# Patient Record
Sex: Female | Born: 1955
[De-identification: ages and names within clinical notes are randomized; demographics above are authoritative.]

---

## 2019-03-21 ENCOUNTER — Inpatient Hospital Stay
Admit: 2019-03-21 | Discharge: 2019-03-22 | Disposition: A | Discharge status: Home / Self Care | Payer: BLUE CROSS/BLUE SHIELD | Source: Ambulatory Visit

## 2019-03-22 DIAGNOSIS — D106 Benign neoplasm of nasopharynx: Secondary | ICD-10-CM

## 2022-06-18 IMAGING — DX HIP BILATERAL WITH PELVIS 5 VIEWS
1 series · 5 of 5 positions shown · non-contrast
Comparison: None.

________________________________________________________________________________________________ 
HIP BILATERAL WITH PELVIS 5 VIEWS, 06/18/2022 [DATE]: 
CLINICAL INDICATION: Pain In Unspecified Hip.

[Series 1: AP · U · 0.14mm/px · 5 of 5 slices shown]
[im 1/5]
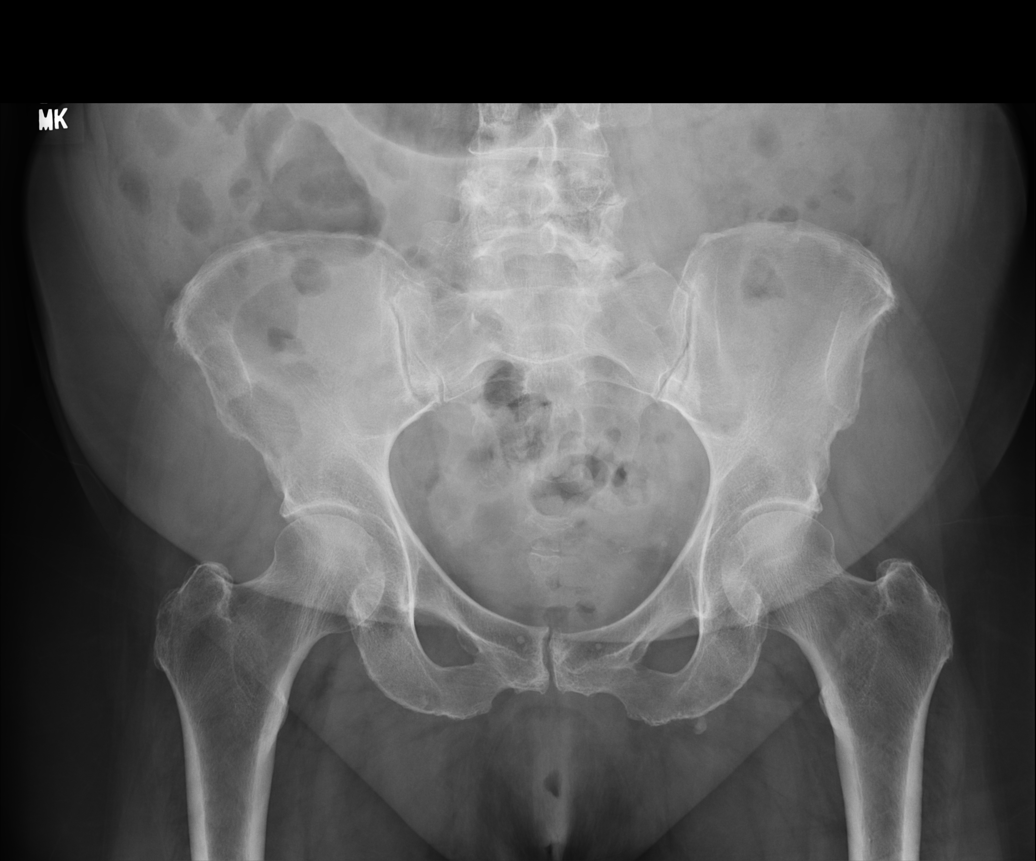
[im 2/5]
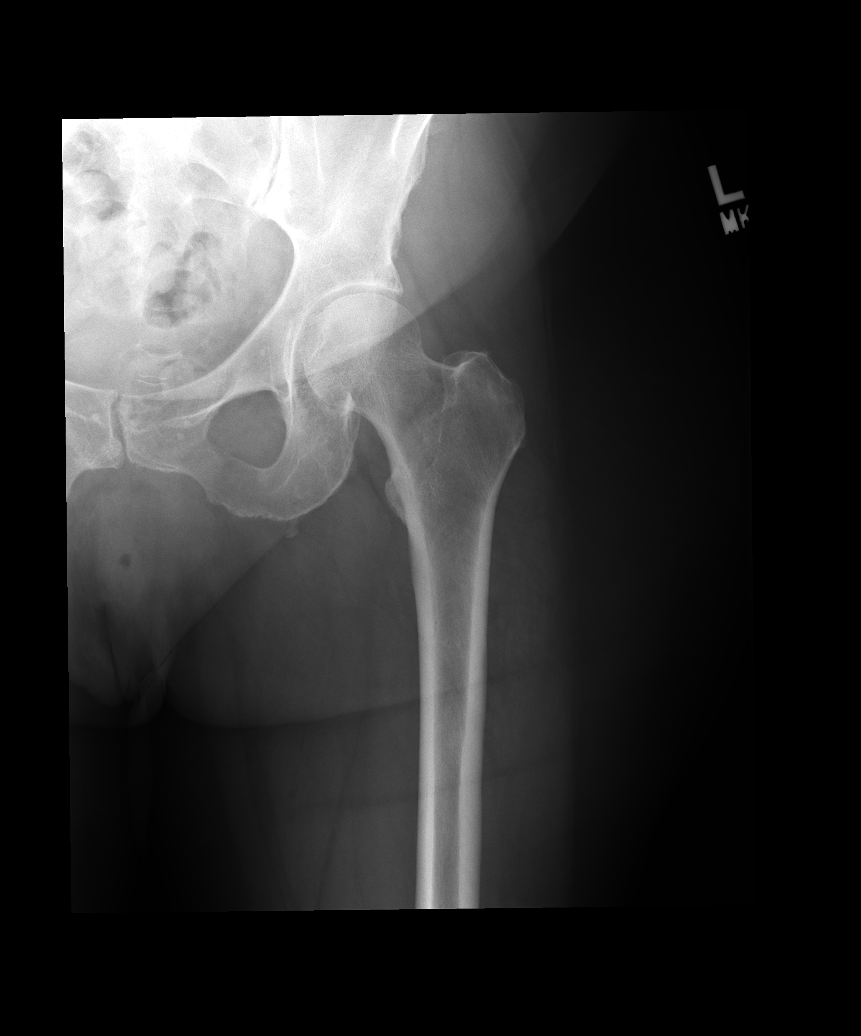
[im 3/5]
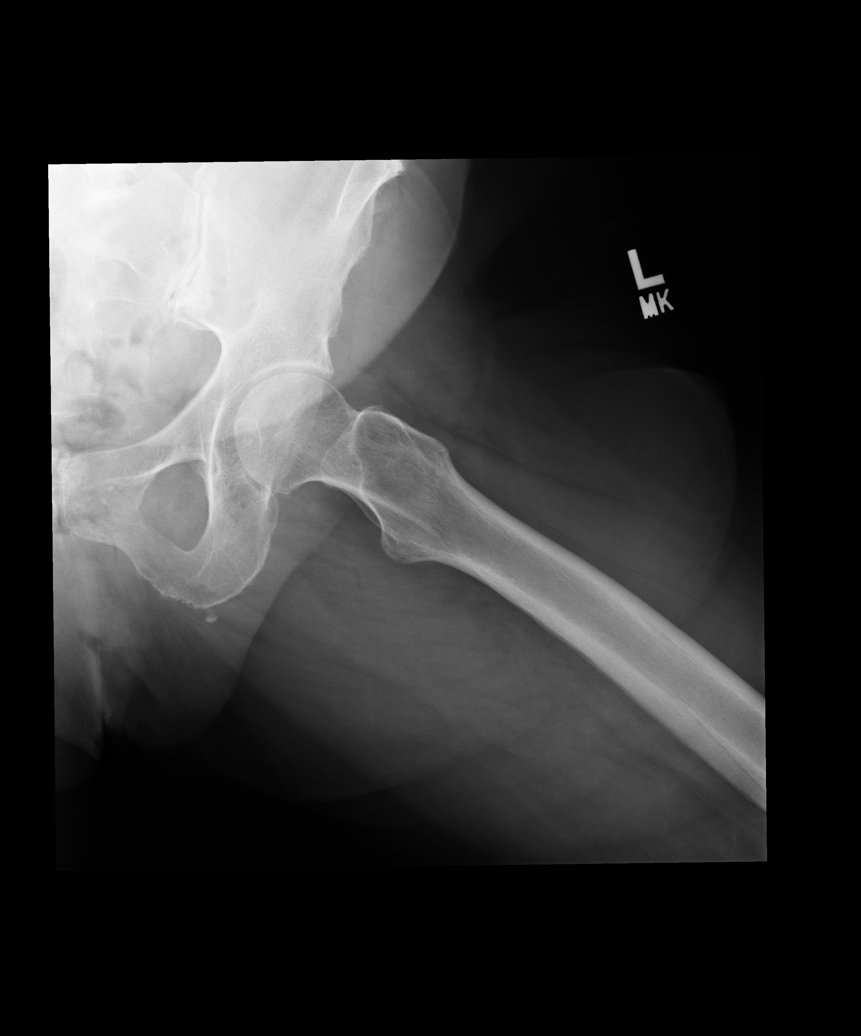
[im 4/5]
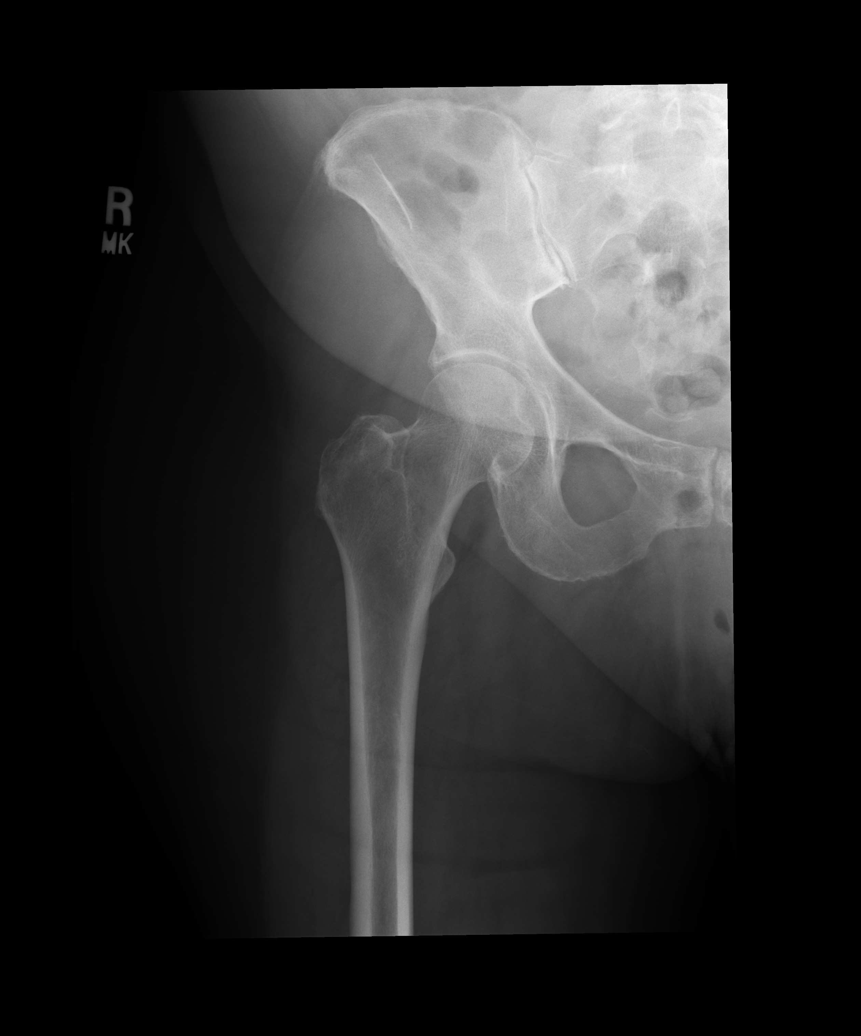
[im 5/5]
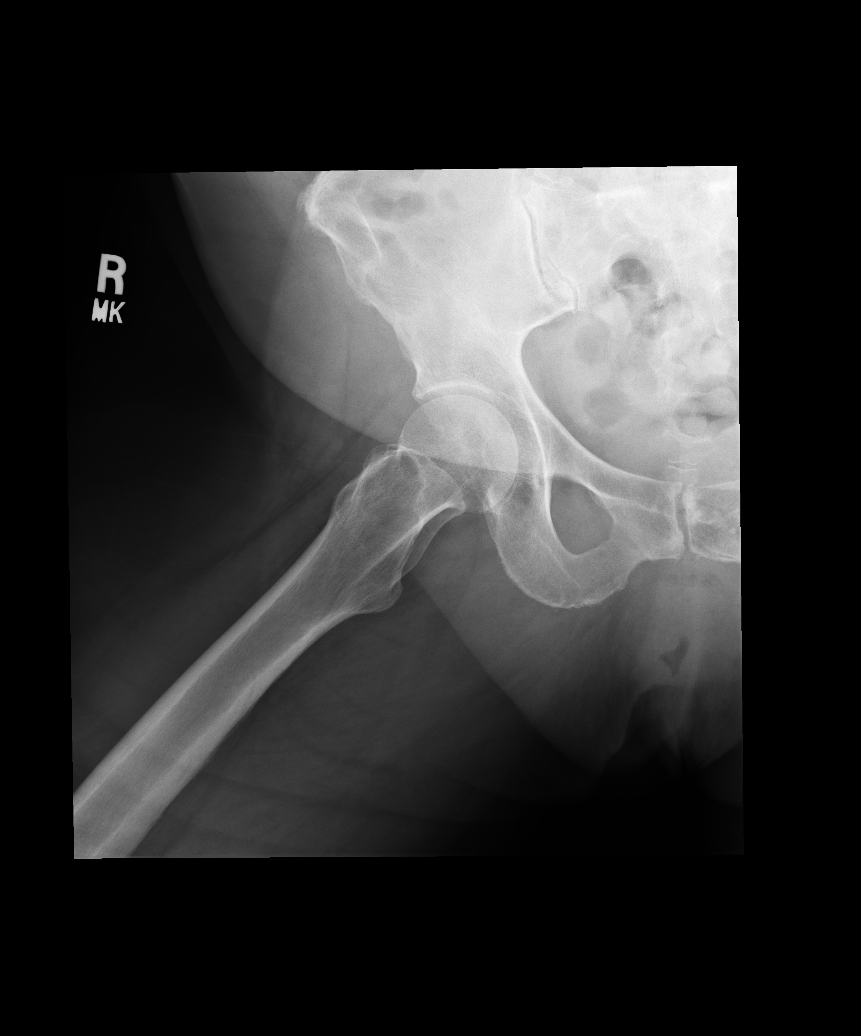

[5 of 5 positions shown; findings below may reference images not displayed]

FINDINGS: No fracture. Normal alignment. Hip joint spaces are preserved. 
Moderate degenerative change of the pubic symphysis. Mild degenerative change of 
the SI joints. Degenerative change of the spine and lumbosacral segment. 
Osteopenia. Mild enthesopathy. Left hamstring calcific tendinosis. Pelvic 
phleboliths.
IMPRESSION: 1.  Hip joint spaces are preserved.  
2.  Left hamstring calcific tendinosis. 
3.  Osteopenia: DXA may be helpful for further evaluation.

## 2023-03-21 IMAGING — MG MAMMOGRAPHY SCREENING BILATERAL 3[PERSON_NAME]
6 of 10 series · 6 of 30 positions shown · non-contrast
Comparison: None available

________________________________________________________________________________________________ 
MAMMOGRAPHY SCREENING BILATERAL 3POLIN BILLIOT, 03/21/2023 [DATE]: 
CLINICAL INDICATION: Encounter for screening mammogram.
TECHNIQUE: Digital bilateral mammograms and 3-D Tomosynthesis were obtained. 
These were interpreted both primarily and with the aid of computer-aided 
detection system.  
BREAST DENSITY: (Level B) There are scattered areas of fibroglandular density.

[L MLO]
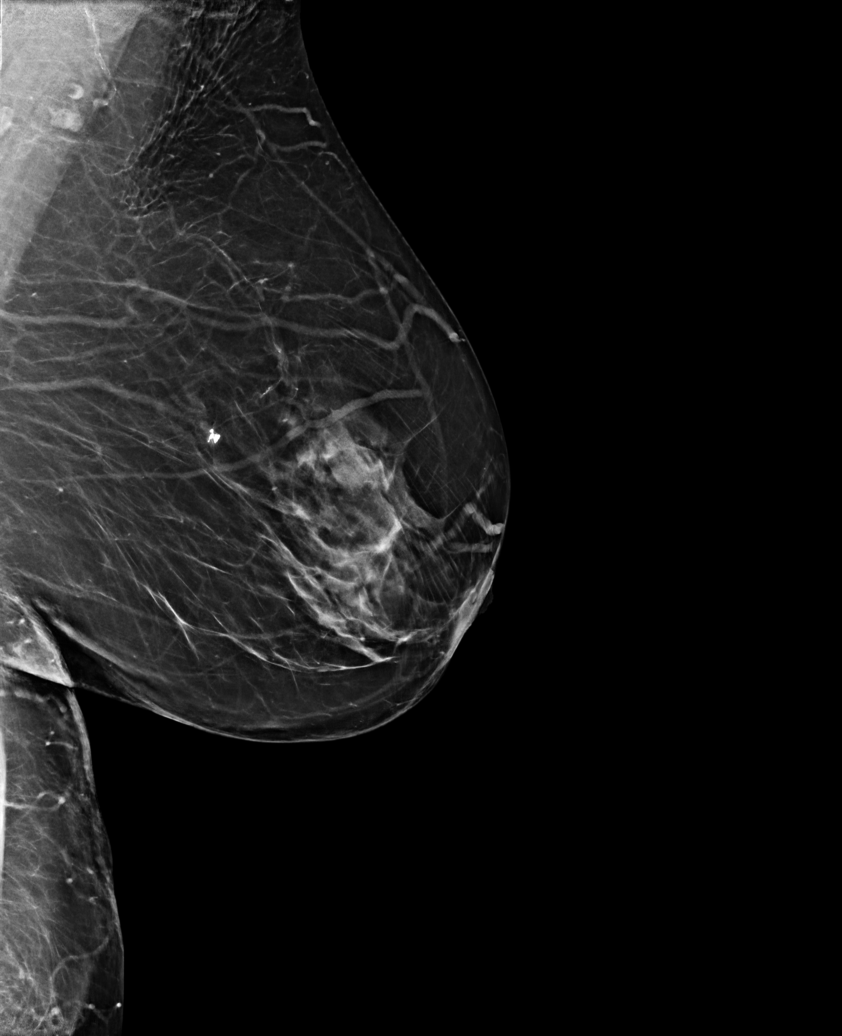

[R MLO (1 of 2)]
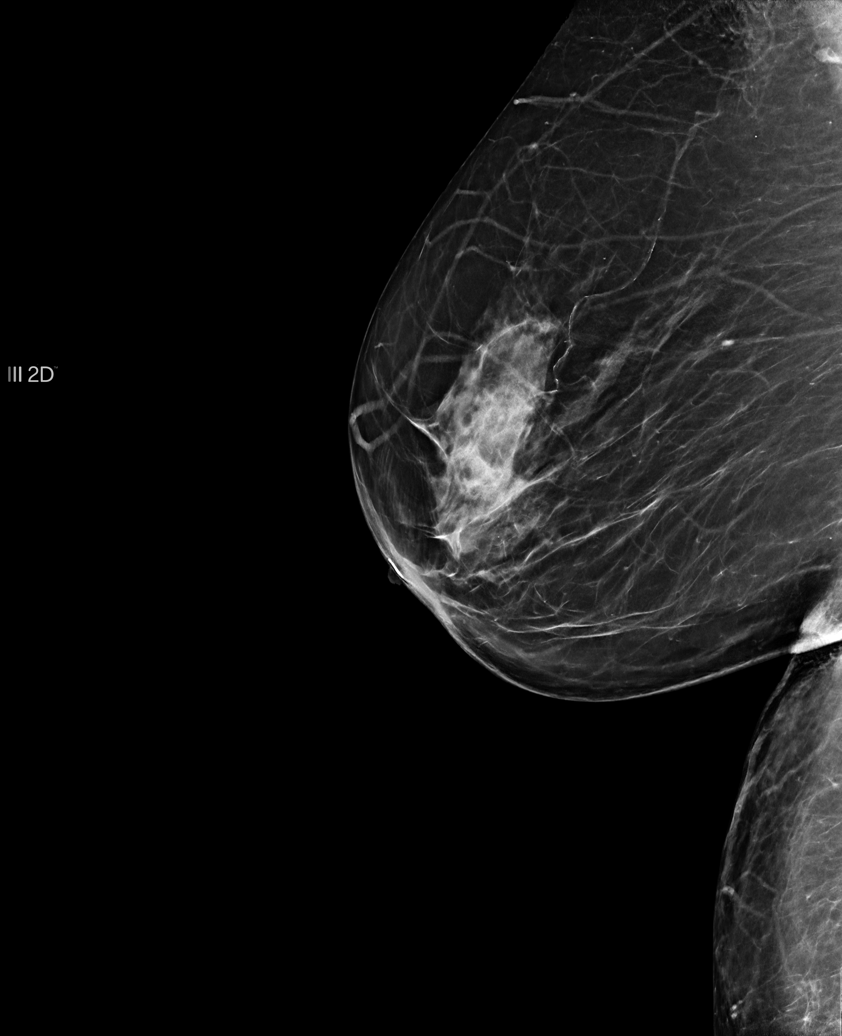

[L CC]
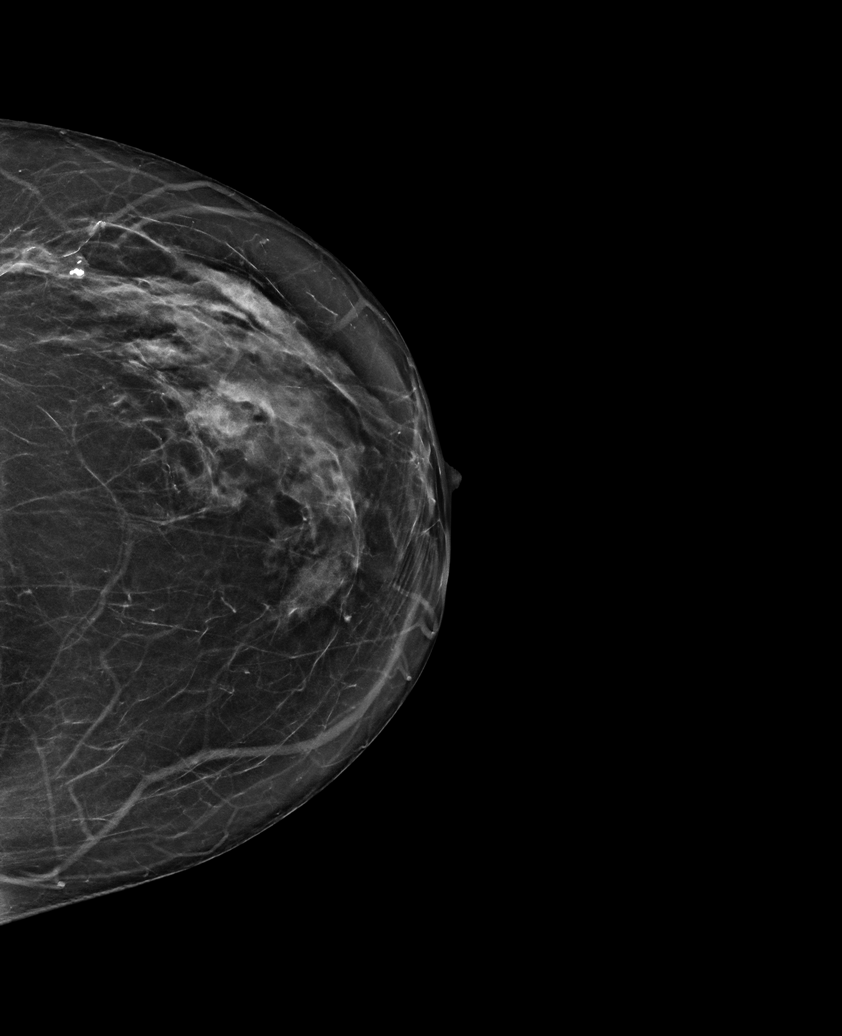

[R MLO (2 of 2)]
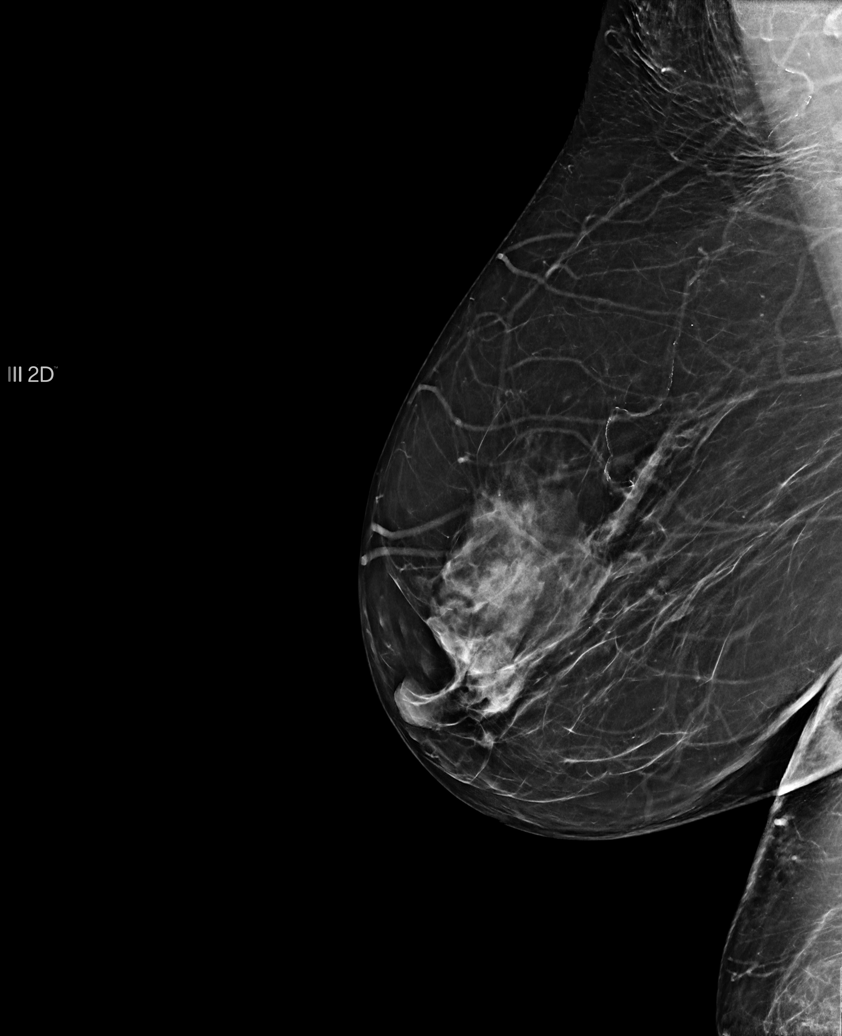

[R CC]
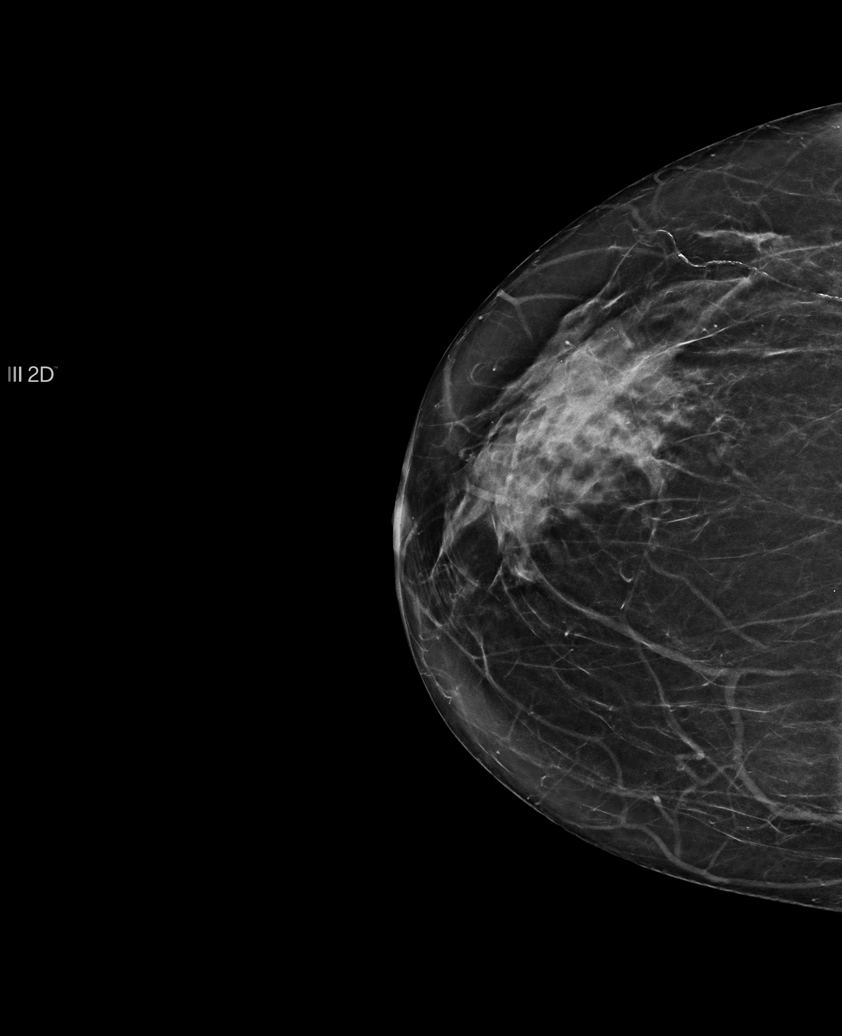

[R MLO tomo · tomo slice 38/75.0]
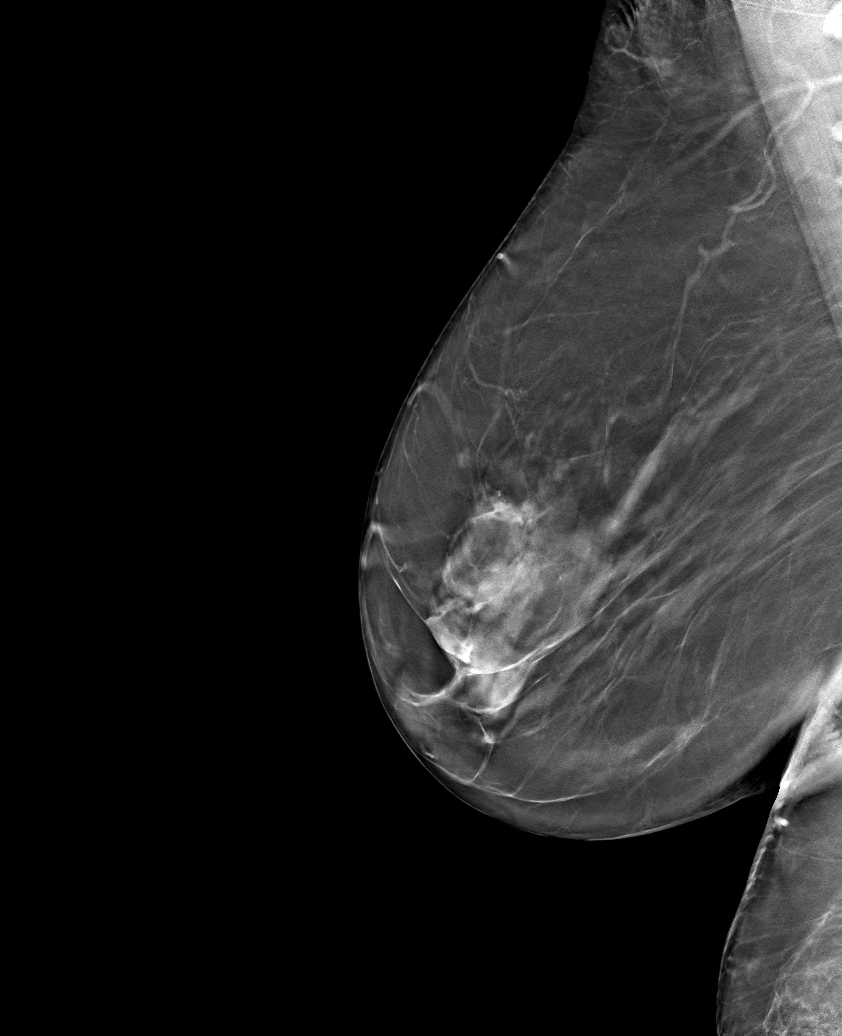

[6 of 30 positions shown; findings below may reference images not displayed]

FINDINGS: Question nodularity superiorly within the left breast. I do not have 
prior films for comparison. Patient will be asked to return for additional 
imaging left breast. Findings not well demarcated on the cc view. No suspicious 
abnormality seen on the right.
IMPRESSION: Recommend additional views of the left breast. 
(BI-RADS 0) Incomplete. Further evaluation will be performed as discussed above 
and the results will be reported separately.

## 2023-03-21 IMAGING — CT CT LUNG SCREENING
2 of 3 series · 15 of 36 positions shown, 18 images · non-contrast
Comparison: None

________________________________________________________________________________________________ 
CT LUNG SCREENING, 03/21/2023 [DATE]: 
CLINICAL INDICATION: 60 pack year smoking history. Patient is a current smoker. 
A search for DICOM formatted images was conducted for prior CT imaging studies 
completed at a non-affiliated media free facility.
TECHNIQUE: The chest was scanned from base of neck through the lung bases 
without contrast on a high resolution low dose CT scanner. Routine MPR and MIP 
reconstruction images were performed. The patients dose for this study is
mGy. Count of known CT and Cardiac Nuclear Medicine studies performed in the 
previous 12 months = 0.

[Series 2: axial · axial · 0.69mm/px · z∈[-290,-38]mm · 12 of 140 slices shown, 15 images]
[im 7/140  mediastinal]
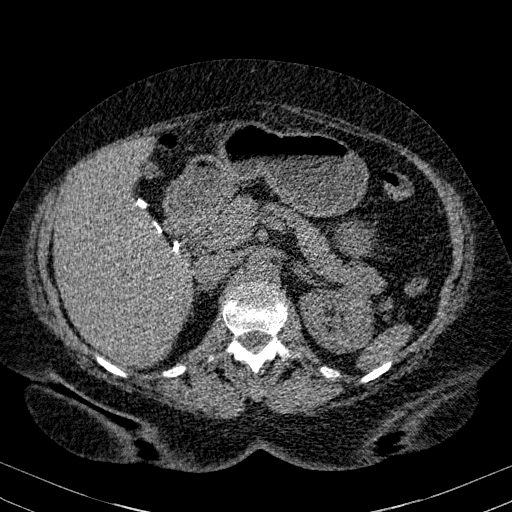
[im 7/140  lung]
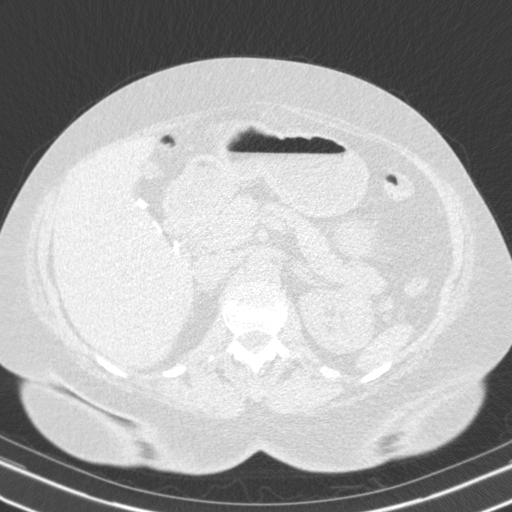
[im 20/140  lung]
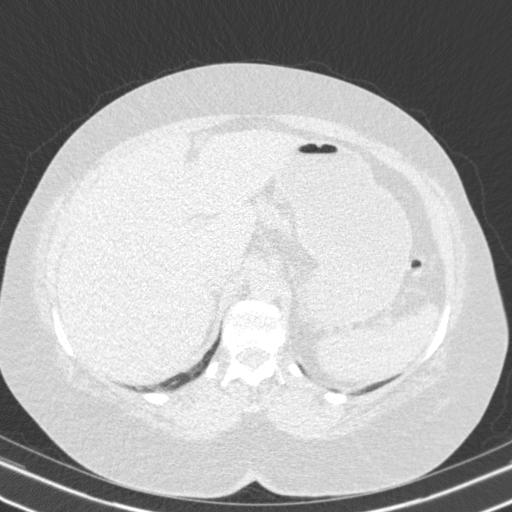
[im 34/140  lung]
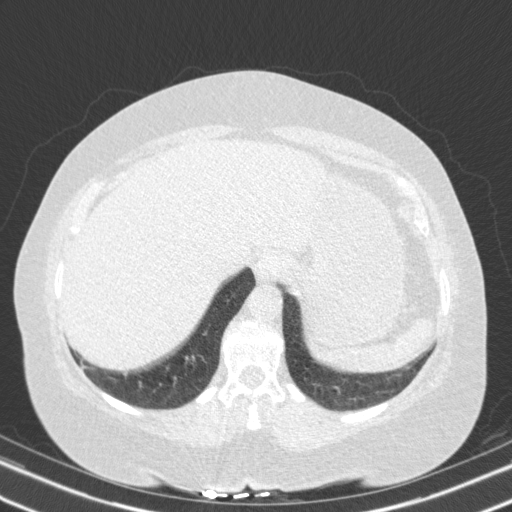
[im 47/140  lung]
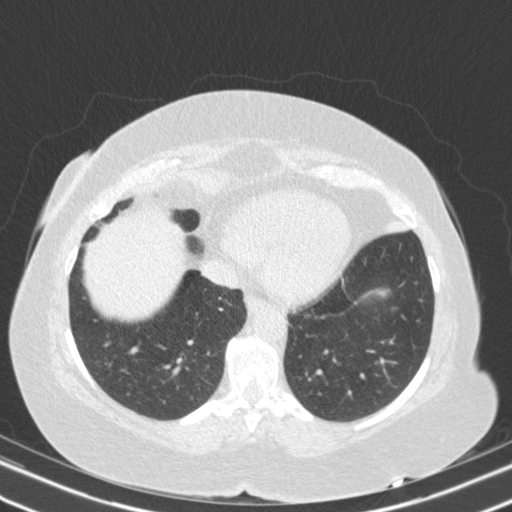
[im 53/140  mediastinal]
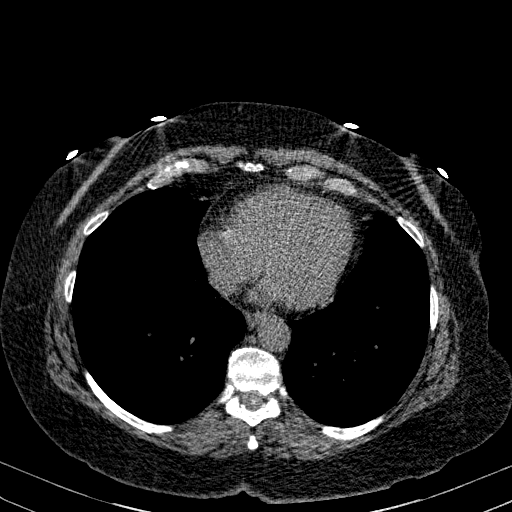
[im 53/140  lung]
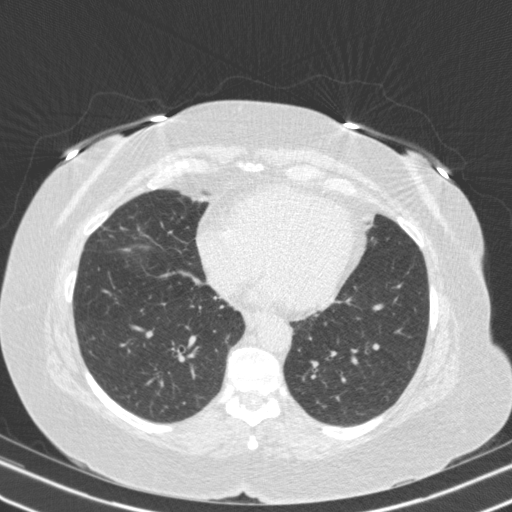
[im 66/140  lung]
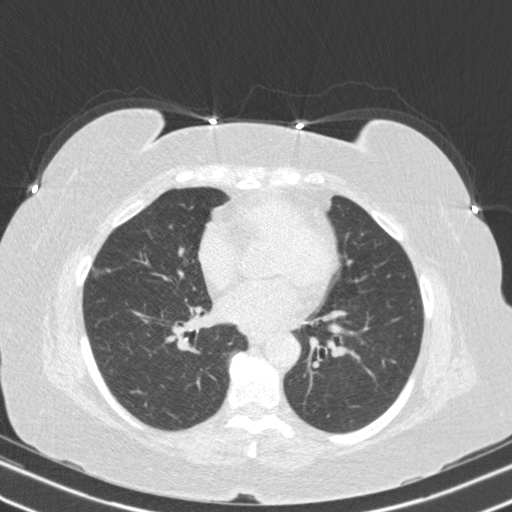
[im 73/140  lung]
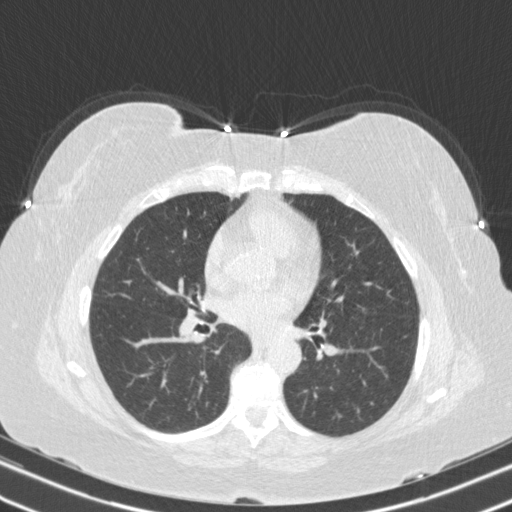
[im 87/140  lung]
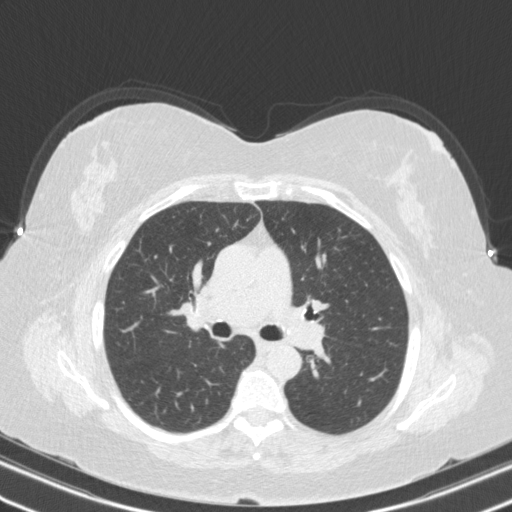
[im 93/140  mediastinal]
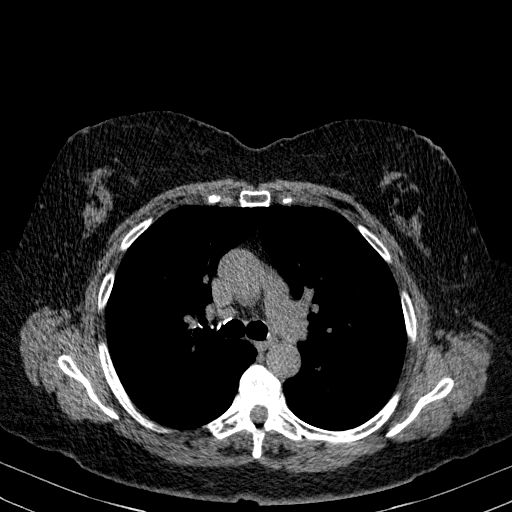
[im 93/140  lung]
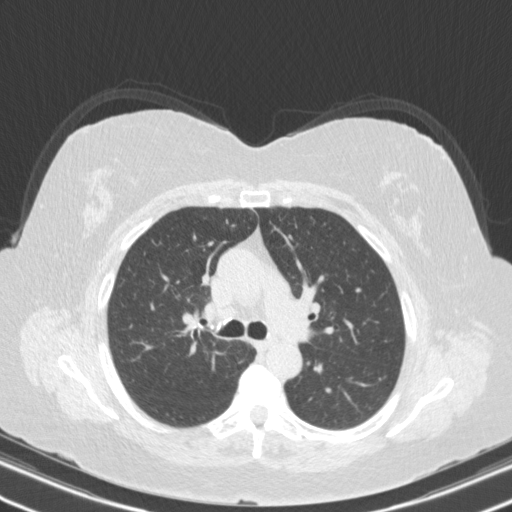
[im 106/140  lung]
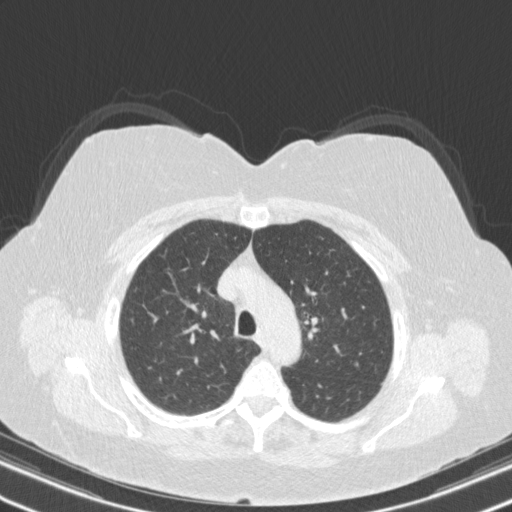
[im 120/140  lung]
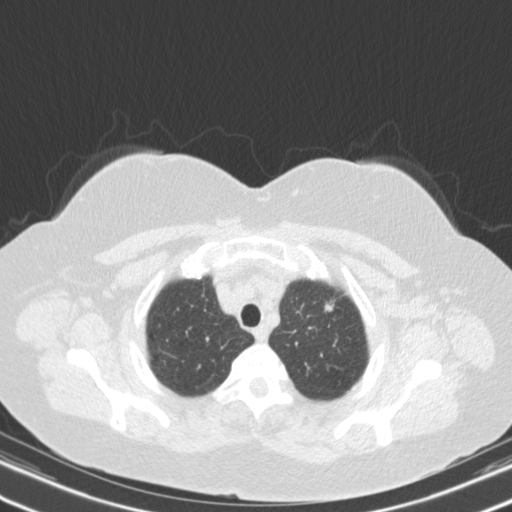
[im 133/140  lung]
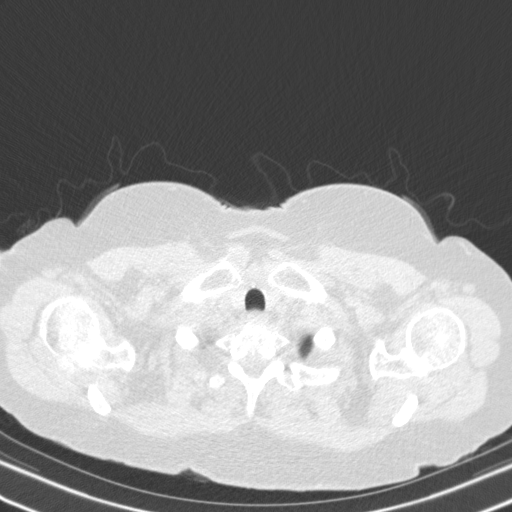

[Series 4: cor · coronal · 0.49mm/px · 3 of 132 slices shown]
[im 27/132  lung]
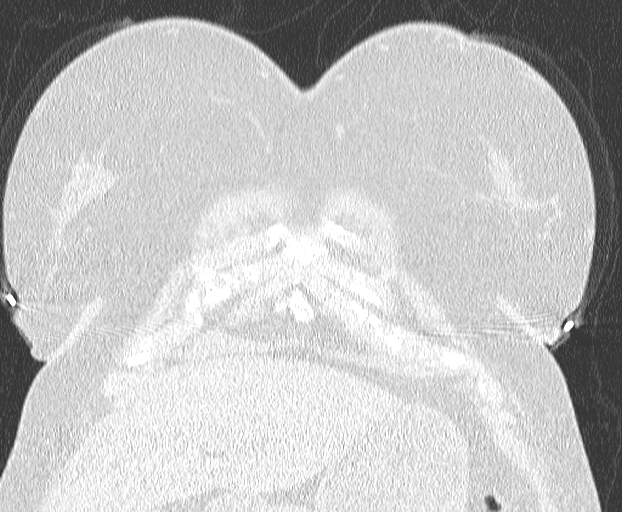
[im 53/132  lung]
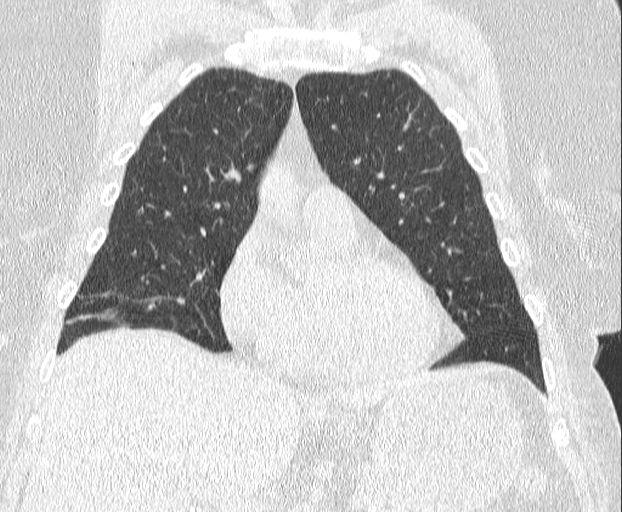
[im 79/132  lung]
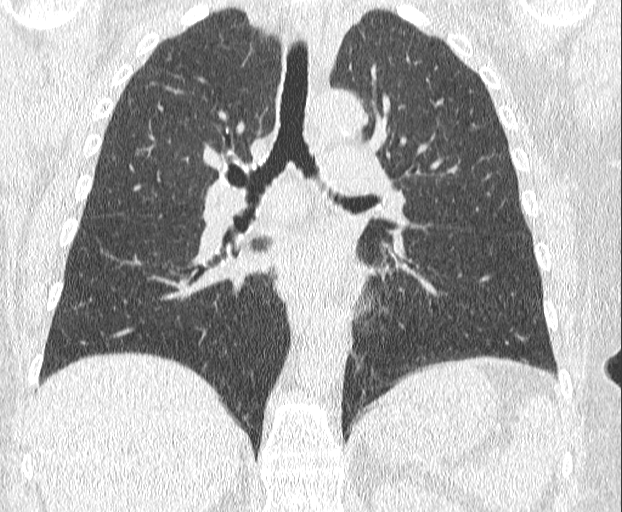

[15 of 36 positions shown; findings below may reference images not displayed]

FINDINGS: LUNGS AND PLEURA:  Prior granulomatous disease with MARLA/ARIANE calcified 
granulomata. Few noncalcified right lung nodules measure up to 2.9 x 1.2 mm in 
the RML (sequence 3 image 63). Mild atelectasis. Mild lower lobe cylindrical 
bronchiectasis. No pleural effusion.  
MEDIASTINUM:  No adenopathy. Normal heart size. No pericardial effusion. 
Atherosclerosis and mild coronary artery calcifications. 
CHEST WALL/AXILLA: No mass or adenopathy.  
UPPER ABDOMEN: Cholecystectomy. 
MUSCULOSKELETAL: No acute abnormality. Degenerative change.
IMPRESSION: 1.  Prior granulomatous disease and few noncalcified right lung nodules (up to 
2.9 x 1.2 mm). 
2.  Mild atelectasis and cylindrical bronchiectasis.  
3.  Cholecystectomy. 
4.  Atherosclerosis and mild coronary artery calcifications. 
5.  Degenerative change. 
L-RADS: Category 2 (benign appearance, <1% chance of malignancy) 
Category 2: continue annual screening with LDCT 

RADIATION DOSE REDUCTION: All CT scans are performed using radiation dose 
reduction techniques, when applicable.  Technical factors are evaluated and 
adjusted to ensure appropriate moderation of exposure.  Automated dose 
management technology is applied to adjust the radiation doses to minimize 
exposure while achieving diagnostic quality images.

## 2023-11-09 IMAGING — DX HIP BILATERAL WITH PELVIS 3 VIEWS
5 series · 5 of 5 positions shown · non-contrast
Comparison: None.

________________________________________________________________________________________________ 
HIP BILATERAL WITH PELVIS 3 VIEWS, 11/09/2023 [DATE]: 
CLINICAL INDICATION: Pain In Unspecified Hip.

[AP (1 of 3)]
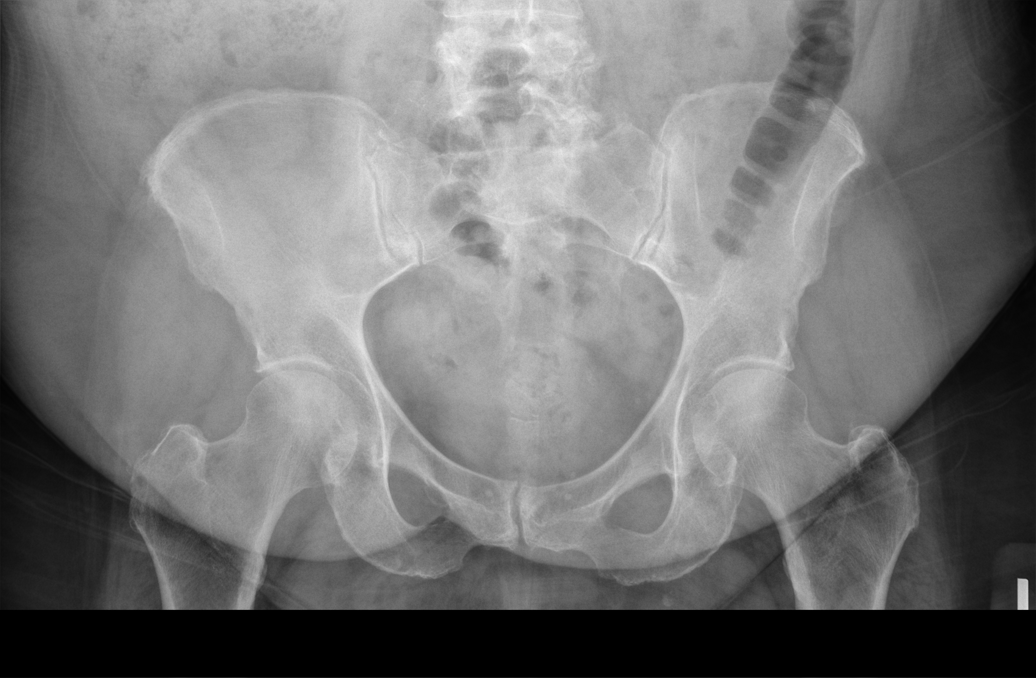

[AP (2 of 3)]
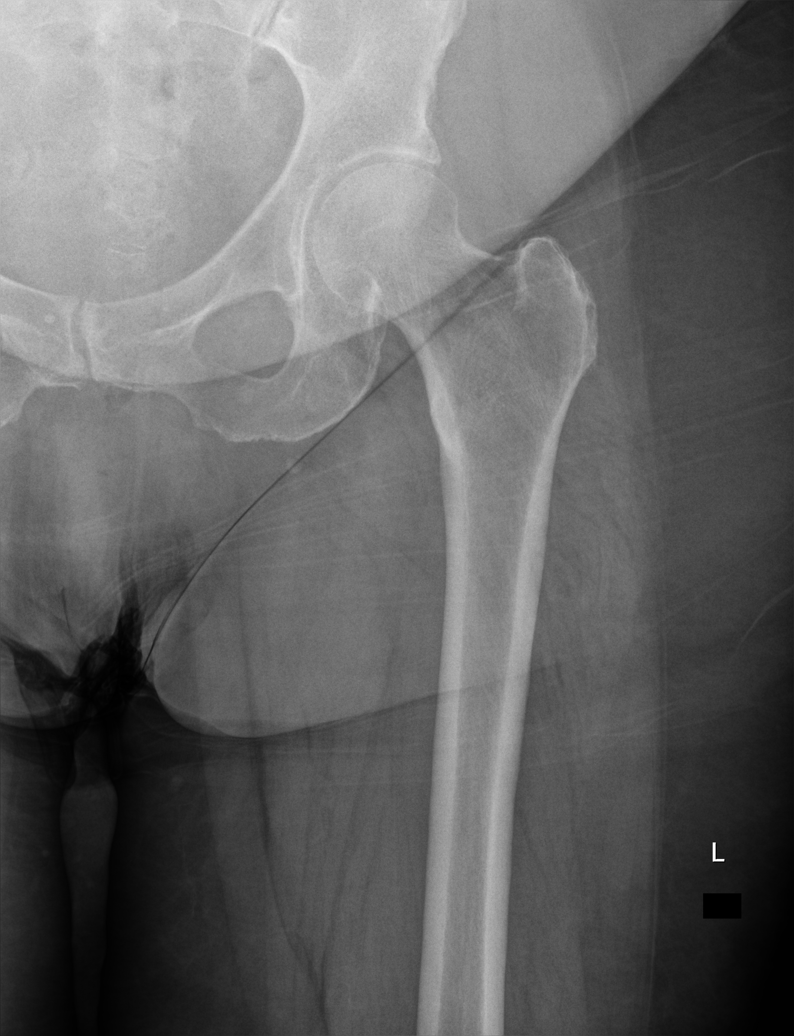

[frog (1 of 2)]
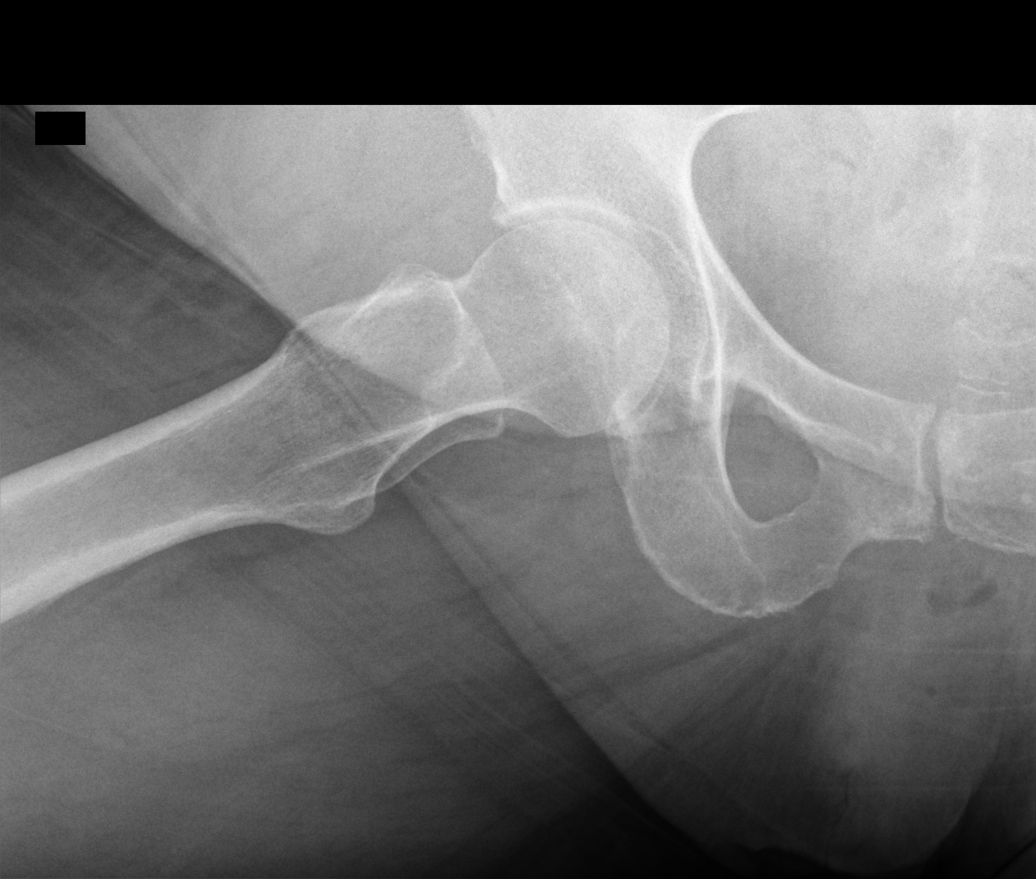

[AP (3 of 3)]
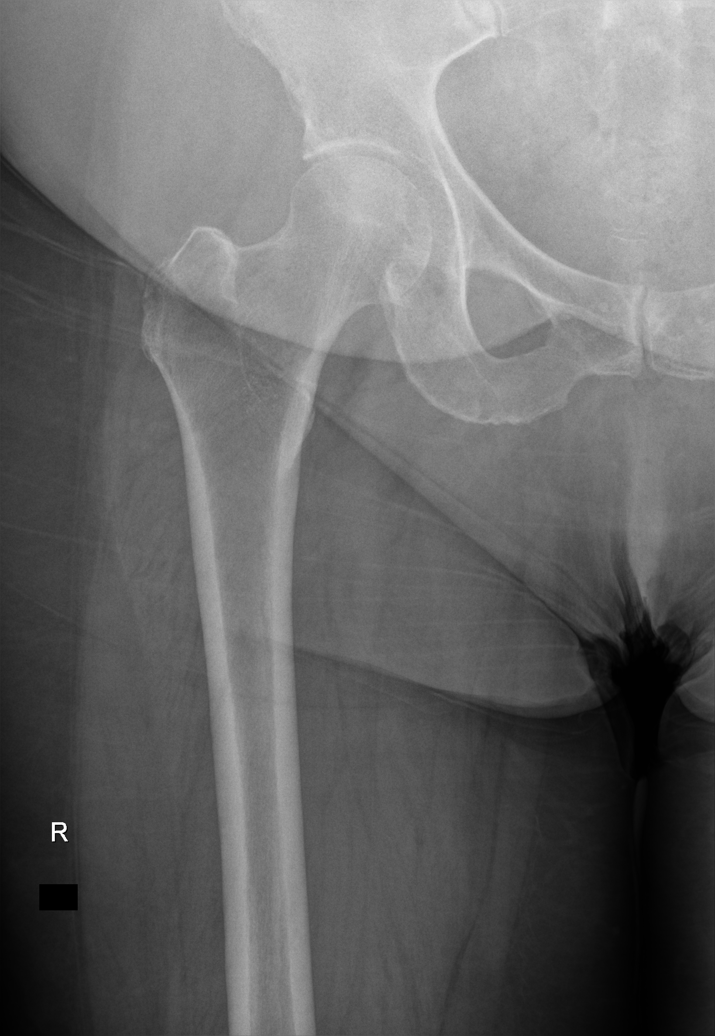

[frog (2 of 2)]
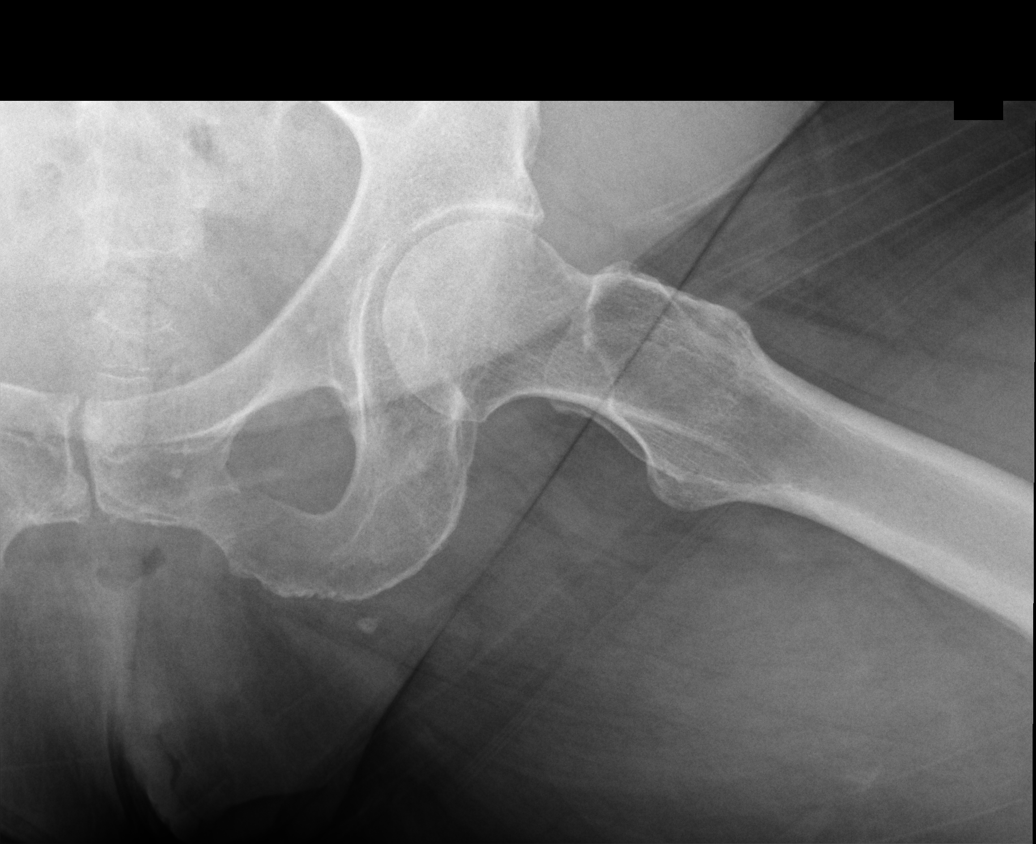

[5 of 5 positions shown; findings below may reference images not displayed]

FINDINGS: No fracture. Normal alignment. Hip joint spaces are preserved. 
Degenerative changes included lower lumbar spine, SI joints and symphysis pubis. 
Soft tissues are negative.
IMPRESSION: No acute osseous abnormality. Consideration could be made for MR exam.

## 2023-11-09 IMAGING — DX CERVICAL SPINE 4 VIEWS
4 series · 4 of 4 positions shown · non-contrast
Comparison: None

________________________________________________________________________________________________ 
CERVICAL SPINE 4 VIEWS, 11/09/2023 [DATE]: 
CLINICAL INDICATION: Radiculopathy, cervical region , pain in right shoulder and 
neck. Numerous prior accidents.

[AP]
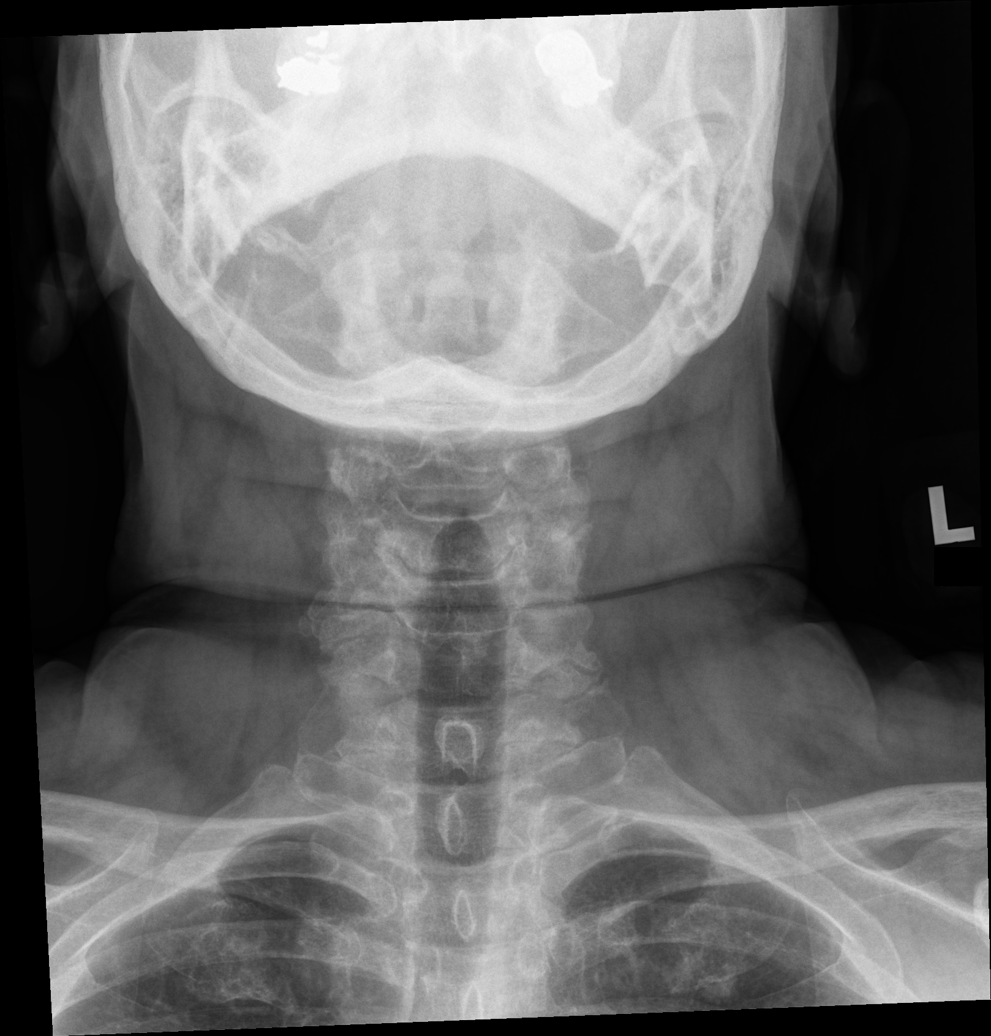

[lateral]
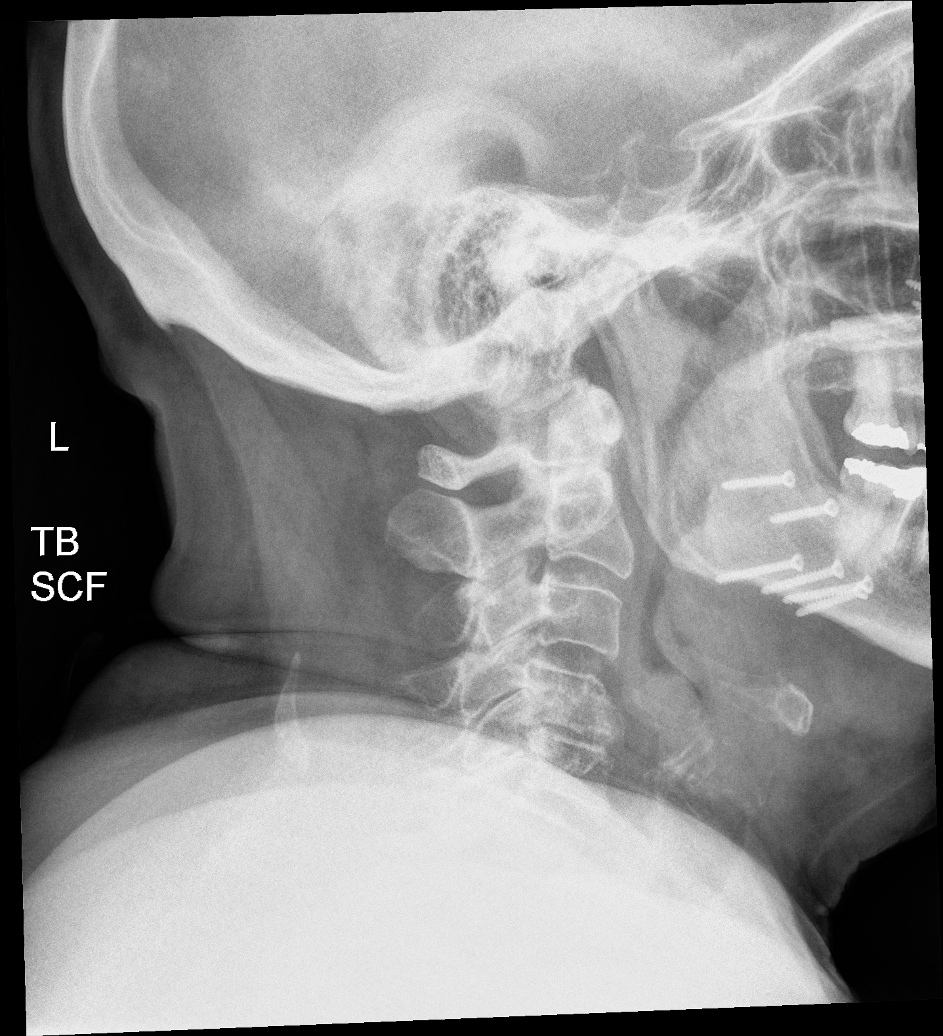

[lateral flex]
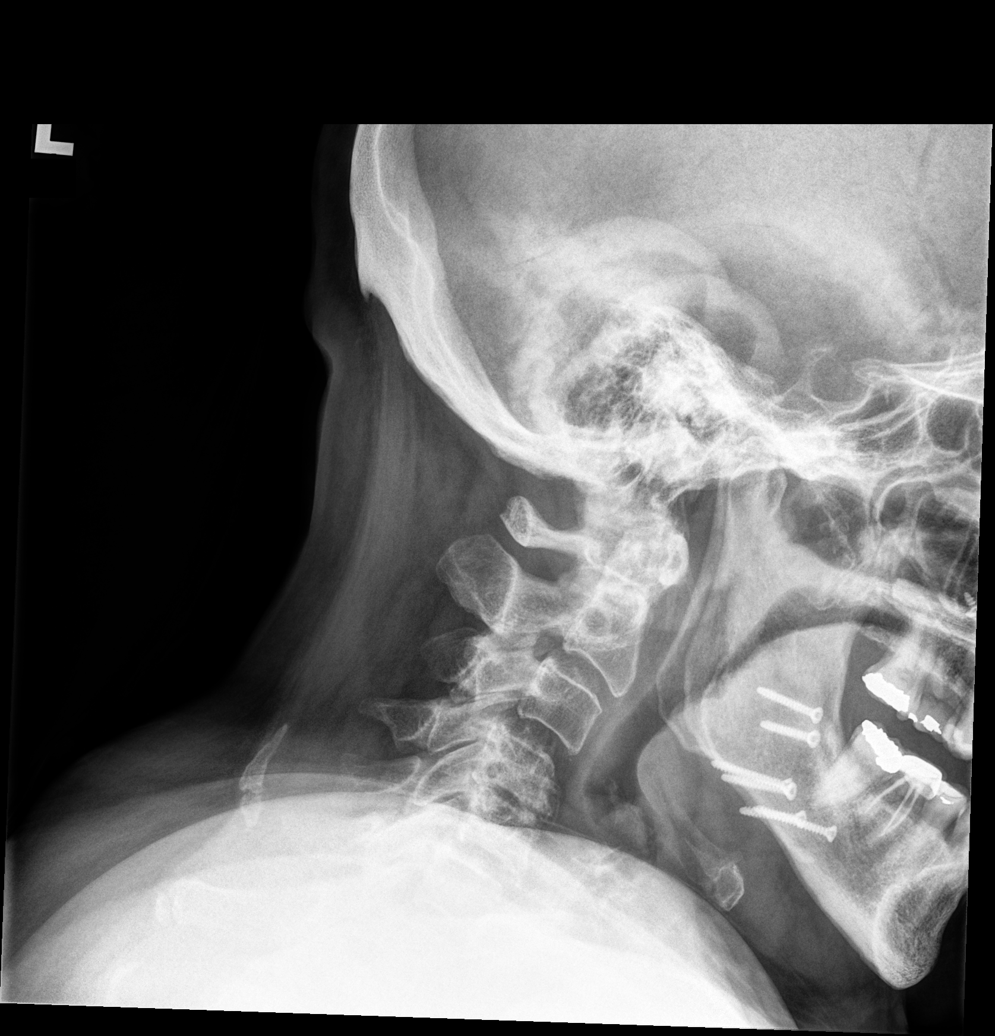

[lateral ext]
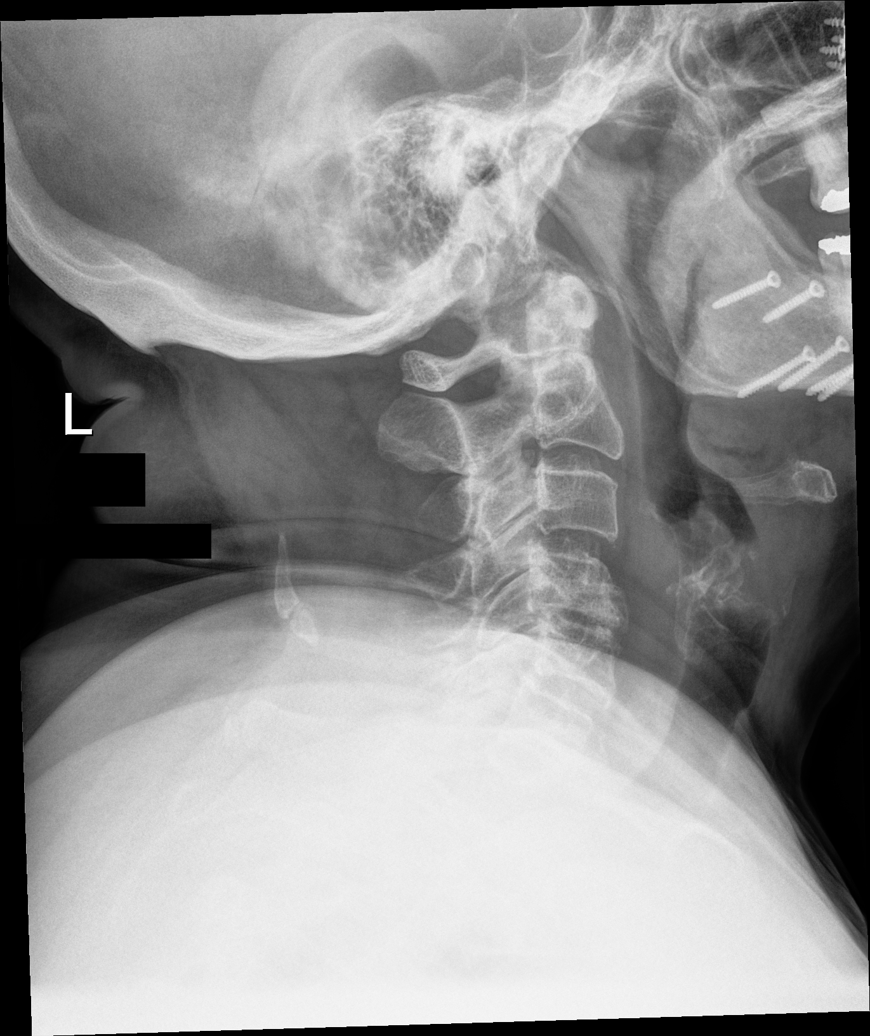

[4 of 4 positions shown; findings below may reference images not displayed]

FINDINGS: The lateral view is only evaluate through the mid C6 level. Normal 
coronal alignment. 3 mm anterolisthesis C3 on C4 with 3 mm retrolisthesis C4 on 
C5. Anterolisthesis C3-C4 increases to 4 mm with flexion. This is consistent 
with a degree of dynamic instability. No dynamic instability noted C4-C5 level. 
Extensive DDD changes C4-C5. The visualized vertebral bodies are preserved in 
height without evidence of fracture. Ossification of portions of the nuchal 
ligament. Uncinate spurring most significant at C4-C5. Multilevel articular 
pillar hypertrophic formation. Prevertebral soft tissues, airway, and lung 
apices are clear. Numerous screws project over and within the left mandible.
IMPRESSION: Dynamic instability C3-C4 level. Cervical spondylosis. Correlate with upcoming 
MRI cervical spine.

## 2023-11-09 IMAGING — DX LUMBAR SPINE AP, LAT WITH FLEXION AND EXTEN
4 series · 4 of 4 positions shown · non-contrast
Comparison: None

________________________________________________________________________________________________ 
LUMBAR SPINE AP, LAT WITH FLEXION AND EXTEN, 11/09/2023 [DATE]: 
CLINICAL INDICATION: Radiculopathy, lumbar region

[AP]
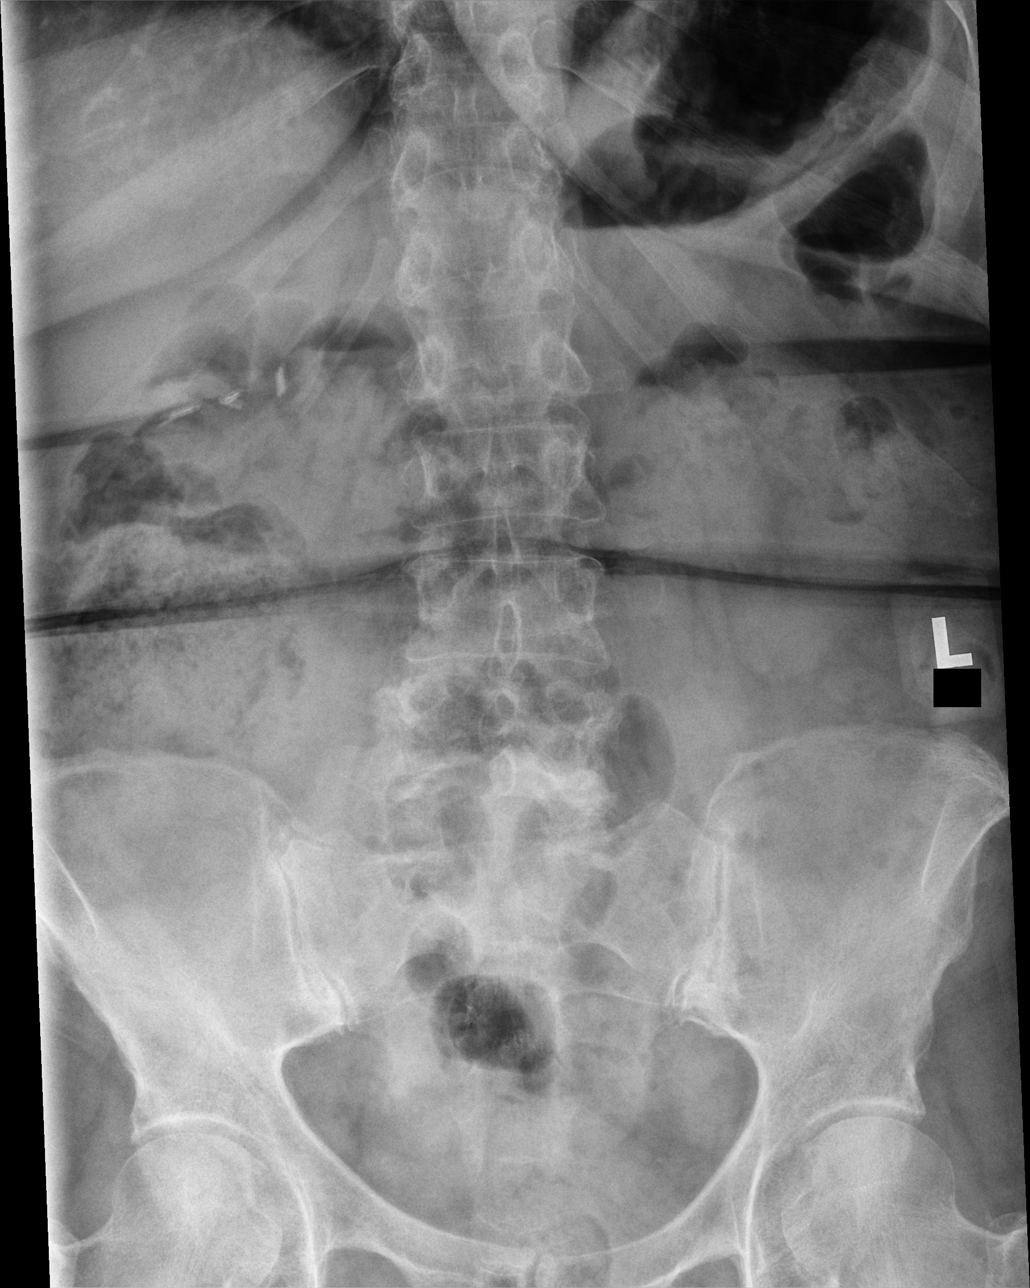

[lateral]
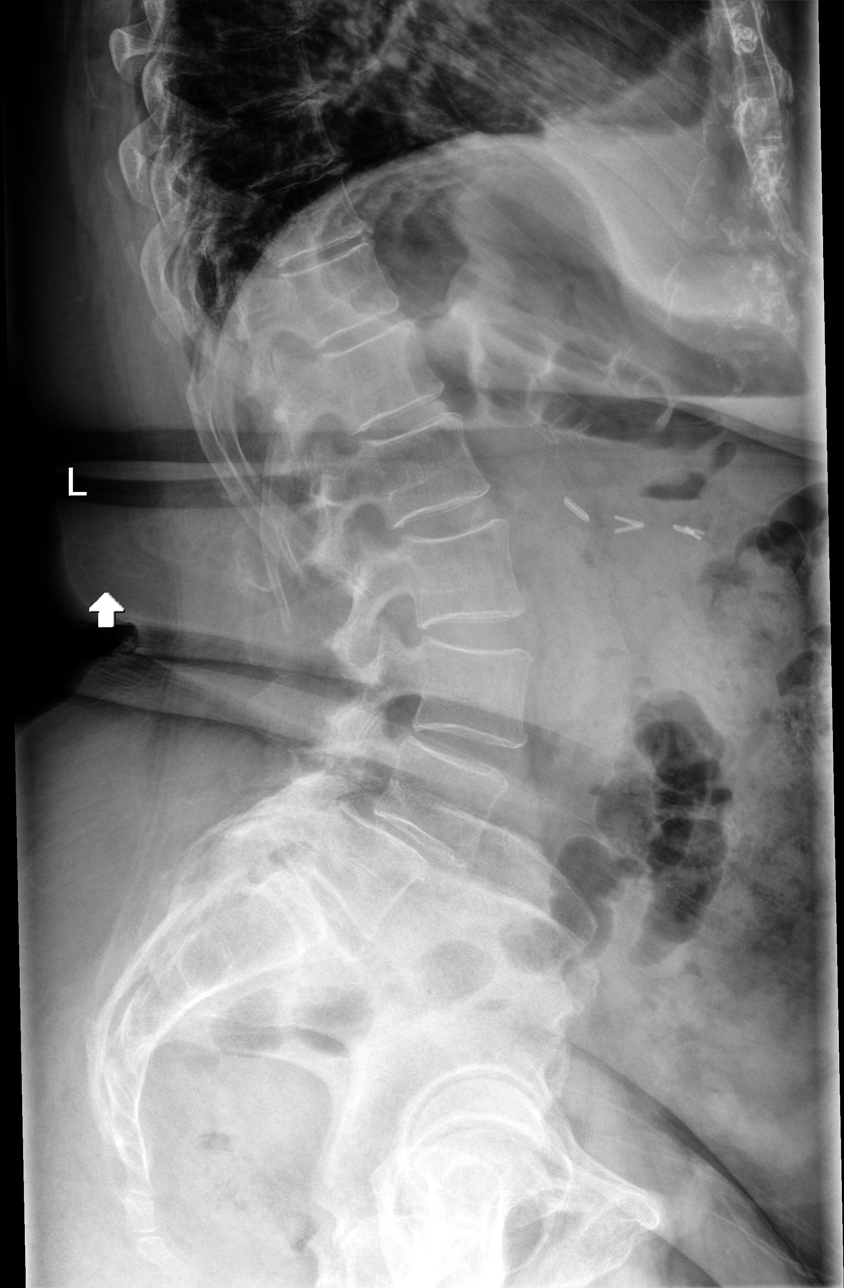

[lateral flex]
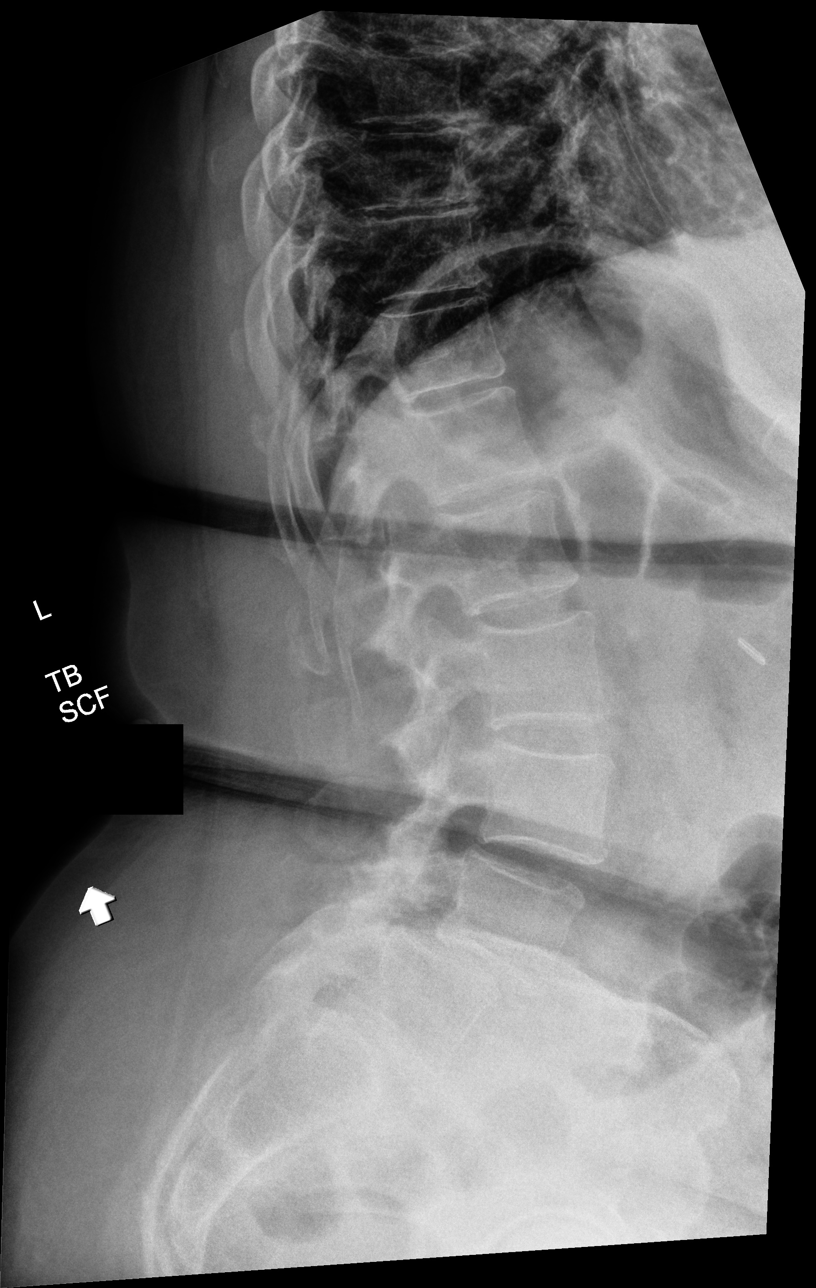

[lateral ext]
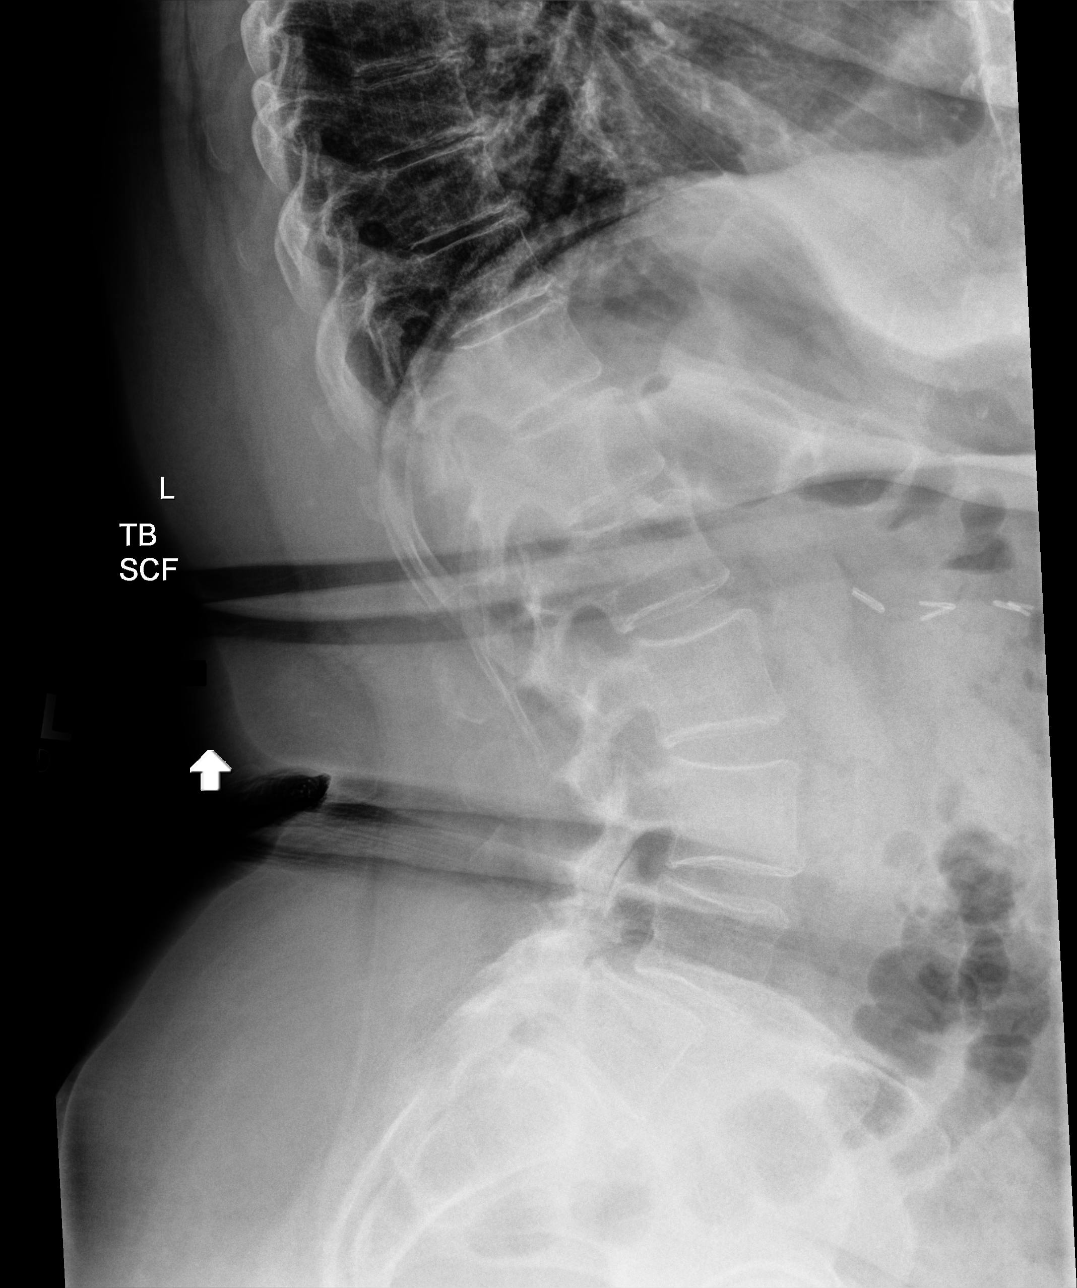

[4 of 4 positions shown; findings below may reference images not displayed]

FINDINGS: 5 lumbar type vertebral bodies. Minimal levoconvex lumbar scoliosis. 
11 mm anterolisthesis L4 on L5. It would be difficult to evaluate for L4 pars 
defects. This can be assessed on follow-up MRI lumbar spine November 24, 2023. 
Otherwise there is anatomic sagittal alignment. No dynamic instability. 
Vertebral body height preserved. DDD changes most significant at L4-L5. Facet 
arthropathy. Right upper quadrant surgical clips. Pelvic ring intact with mild 
degenerative changes involving both SI joints.
IMPRESSION: Lumbar degenerative changes with significant anterolisthesis L4 on L5. See above 
discussion. Correlate with upcoming MRI lumbar spine. 
Osteopenia. Advise further assessment with DEXA with trabecular bone score, if 
not already performed.

## 2023-11-09 IMAGING — DX CHEST PA AND LATERAL
2 series · 2 of 2 positions shown · non-contrast
Comparison: CT scan from March 2023.

________________________________________________________________________________________________ 
CLINICAL INDICATION: Chronic Obstructive Pulmonary Disease, Unspecified.

[PA]
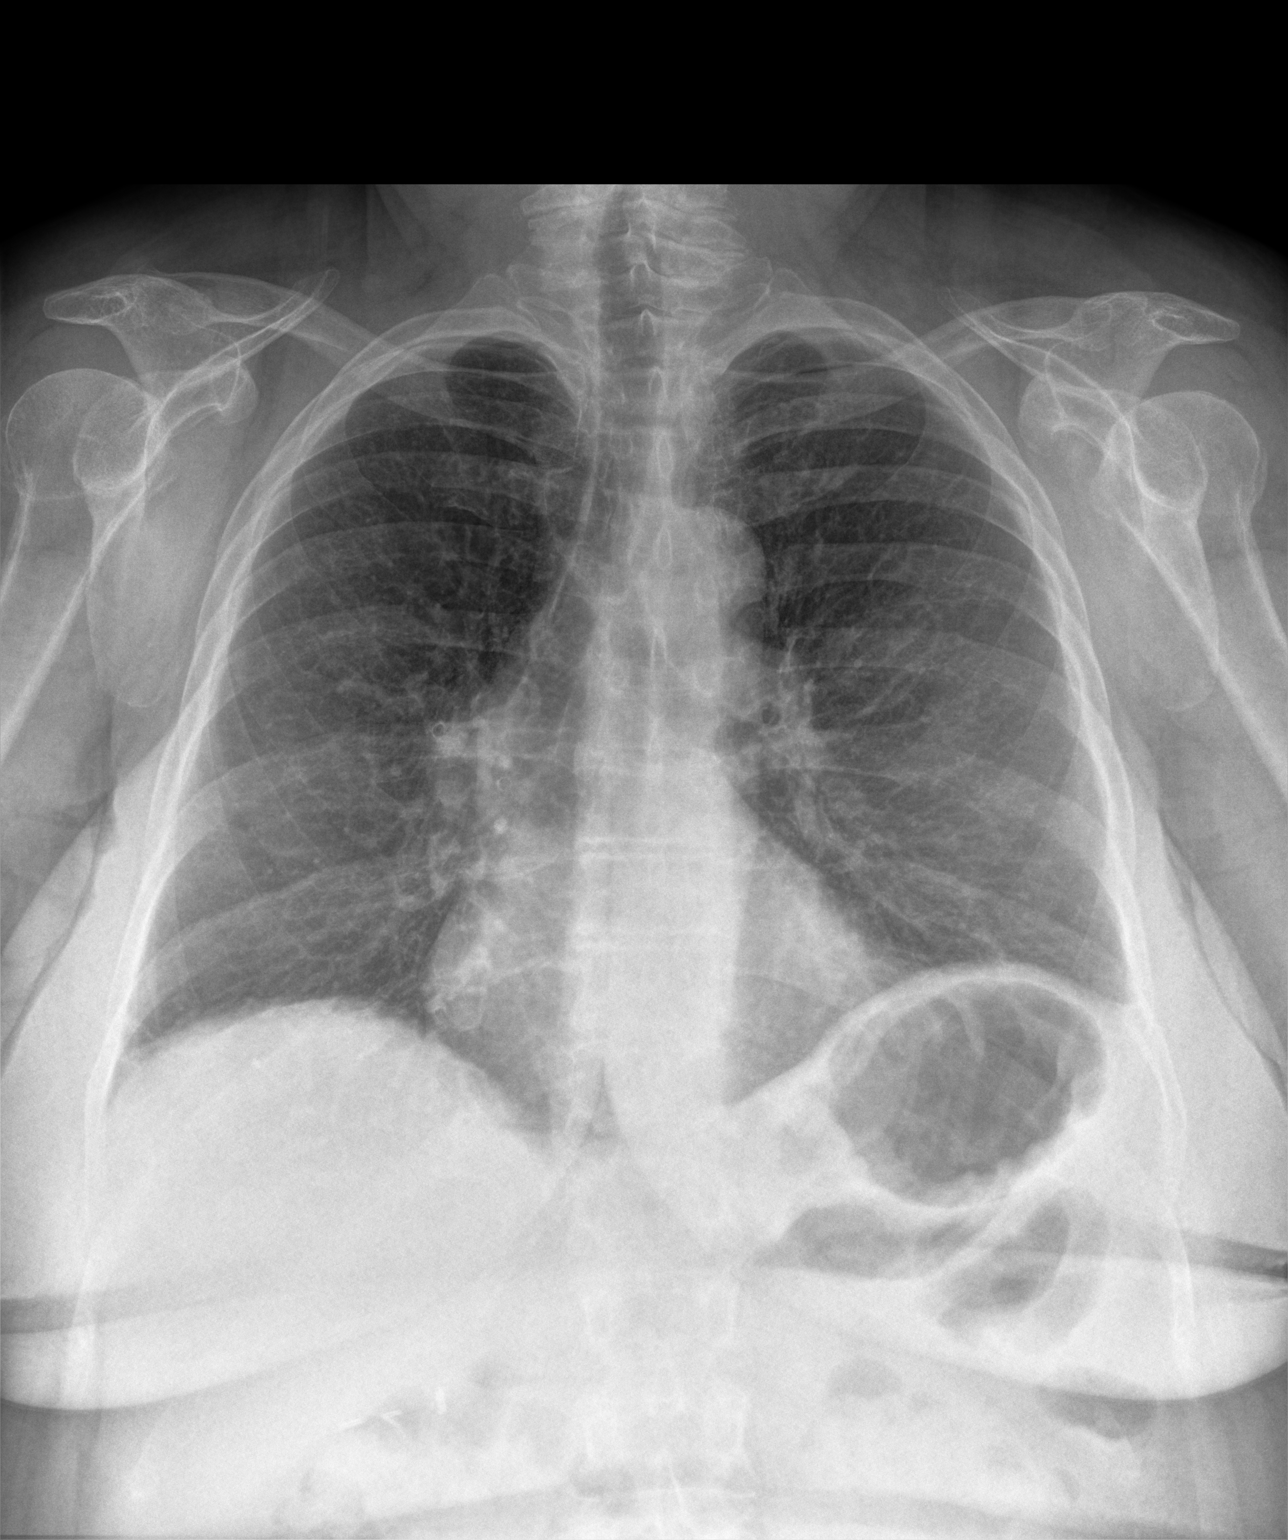

[lateral]
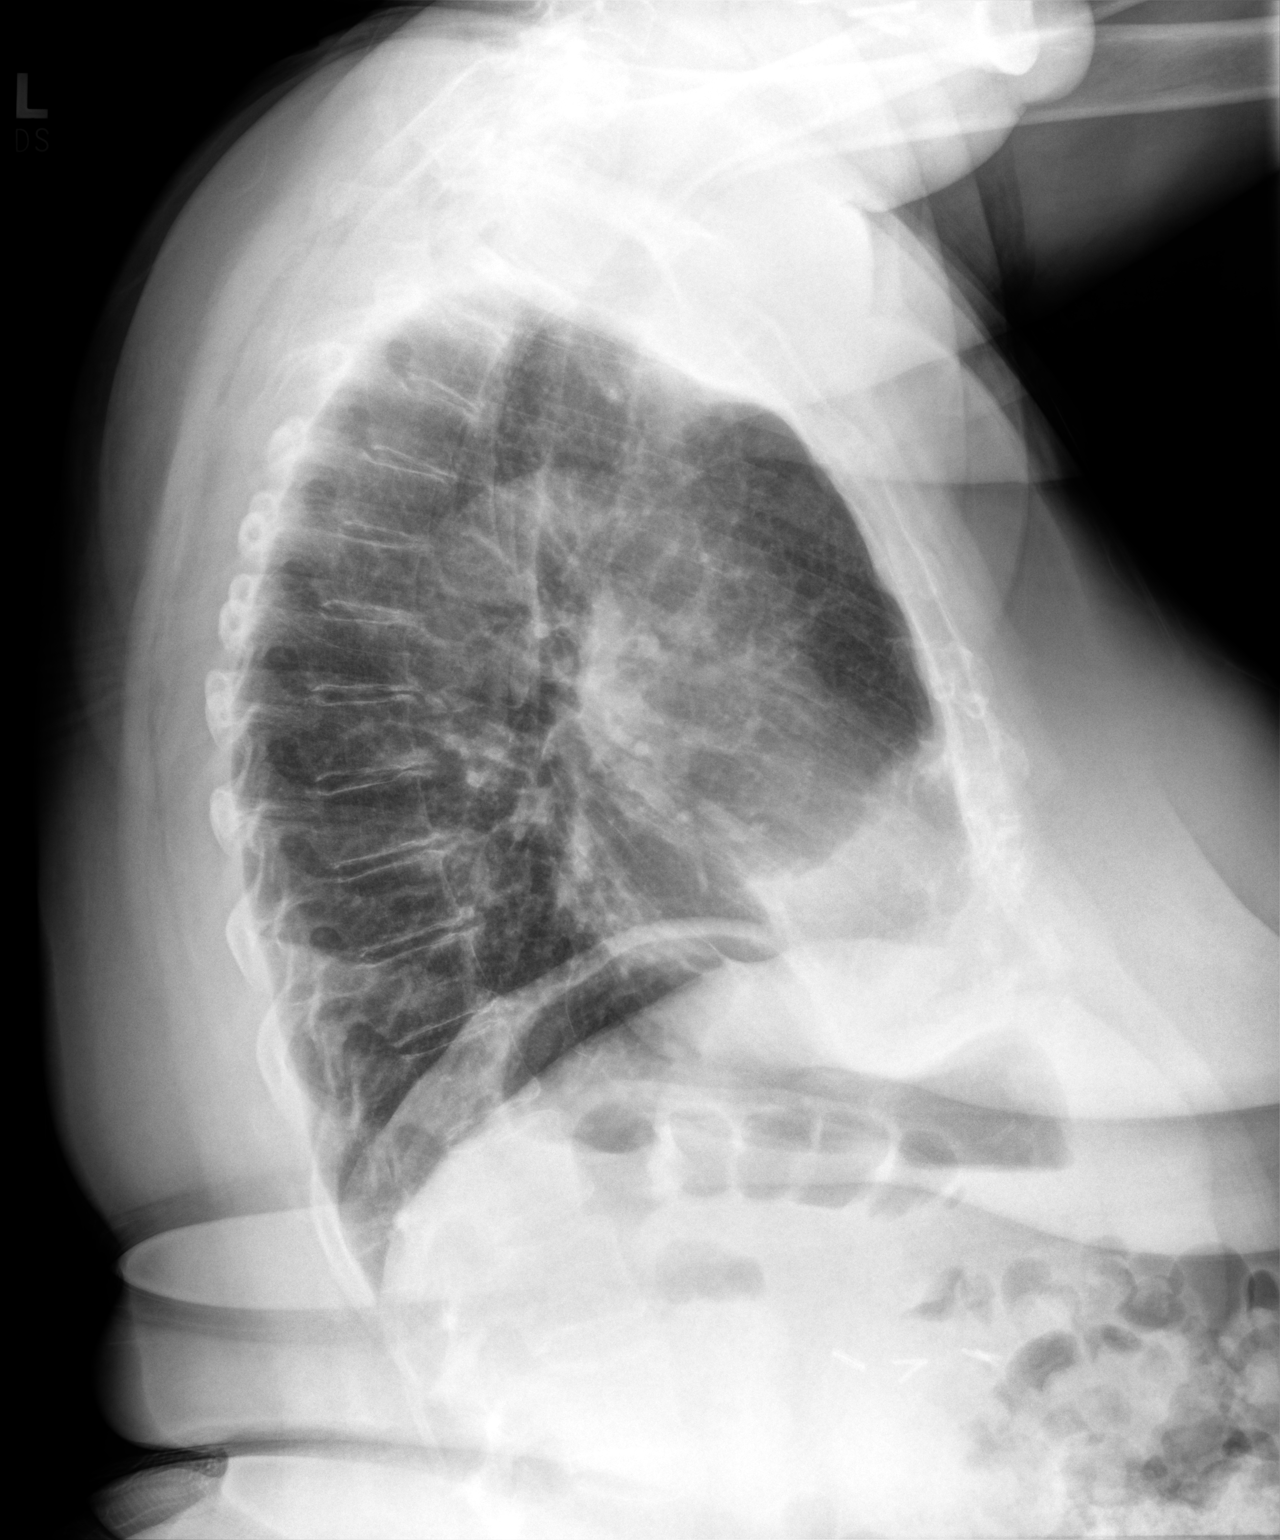

[2 of 2 positions shown; findings below may reference images not displayed]

FINDINGS: Lungs are clear. No consolidation. No effusion. Normal cardiac size 
and pulmonary vascularity. No pneumothorax. No acute osseous abnormality. 
Degenerative changes thoracic spine. Postop changes right upper quadrant.
IMPRESSION: No acute cardiopulmonary findings.

## 2023-11-24 IMAGING — MR MRI LUMBAR SPINE WITHOUT CONTRAST
5 of 8 series · 10 of 48 positions shown · IV contrast (gadolinium)
Comparison: Lumbar spine x-ray November 09, 2023.

________________________________________________________________________________________________ 
MRI LUMBAR SPINE WITHOUT CONTRAST, 11/24/2023 [DATE]: 
CLINICAL INDICATION: Radiculopathy, lumbar region , chronic low back pain with 
radiation down the right leg.
TECHNIQUE: Multiplanar, multiecho position MR images of the lumbar spine were 
performed without intravenous gadolinium enhancement. Patient was scanned on a 
3T magnet

[Series 101: survey · axial · 10.0mm · 1.39mm/px · 1 of 9 slices shown]
[im 1/9]
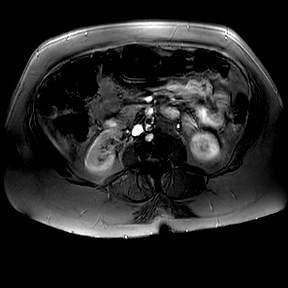

[Series 201: t2w_cor-surv · coronal · 6.0mm · 0.50mm/px · 1 of 7 slices shown]
[im 1/7]
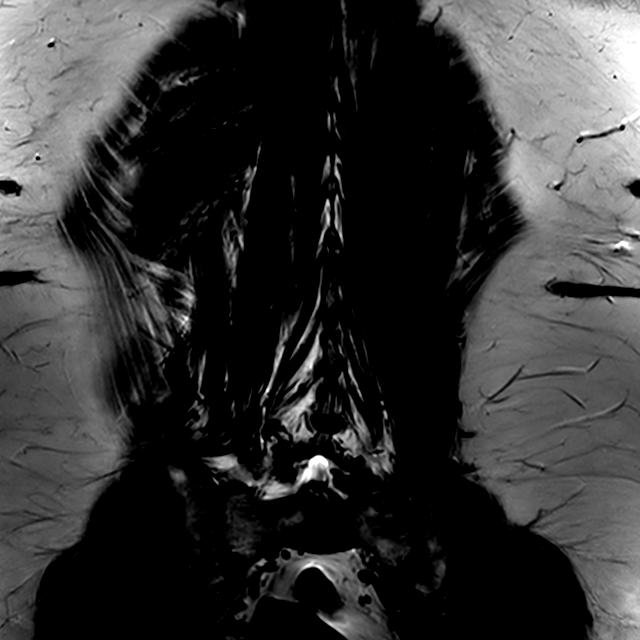

[Series 301: t1w_tse sag · sagittal · 4.0mm · 0.24mm/px · 3 of 17 slices shown]
[im 1/17]
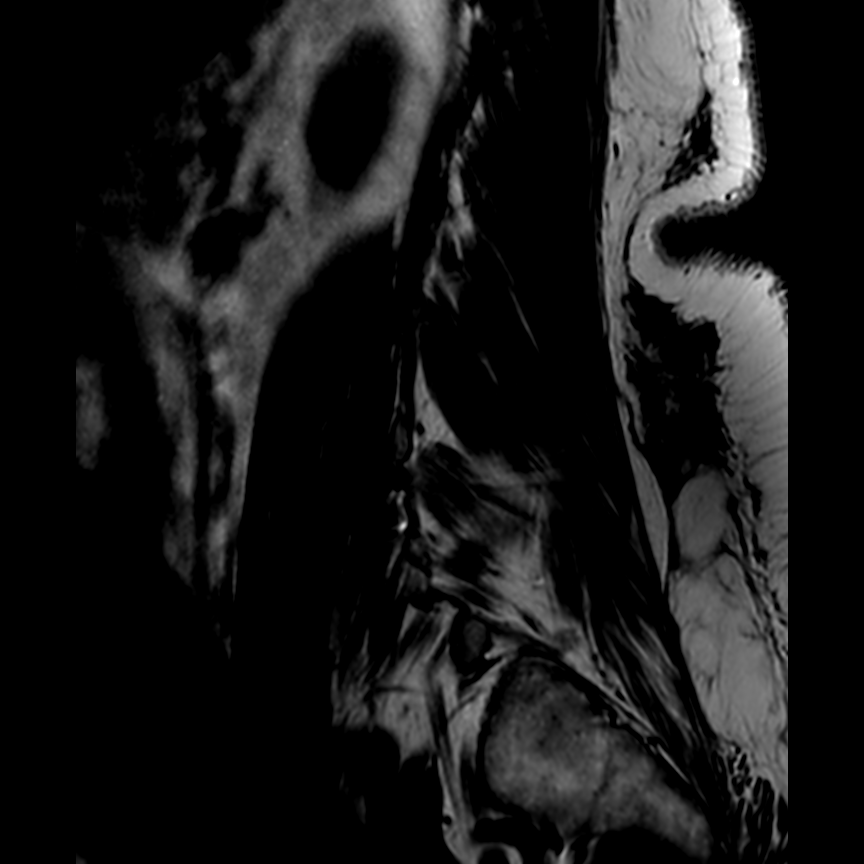
[im 9/17]
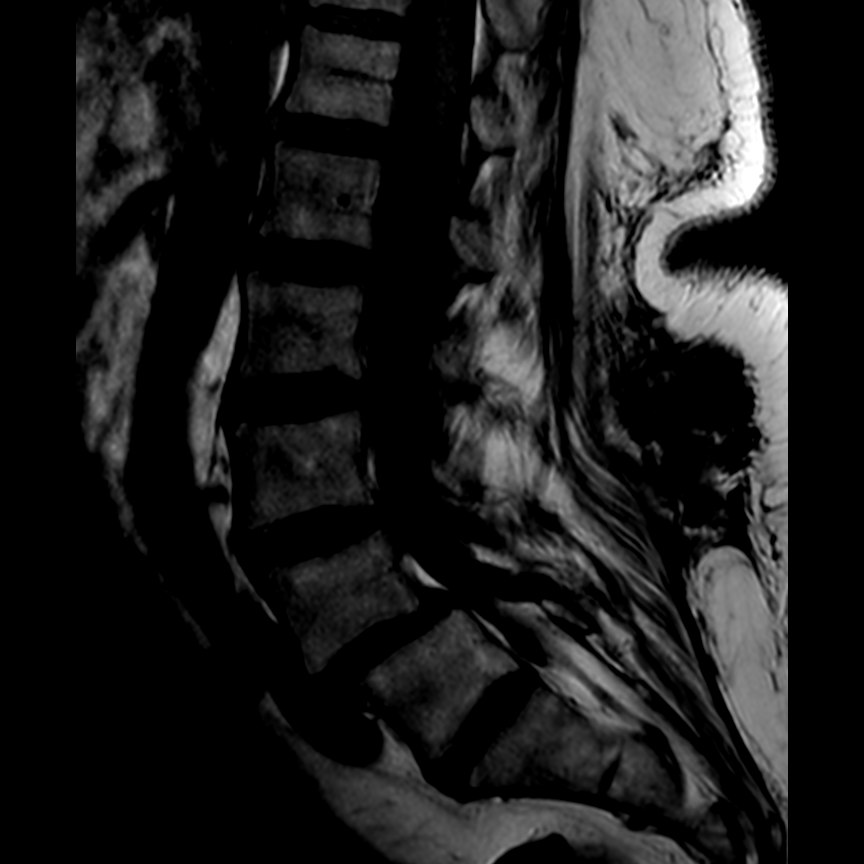
[im 17/17]
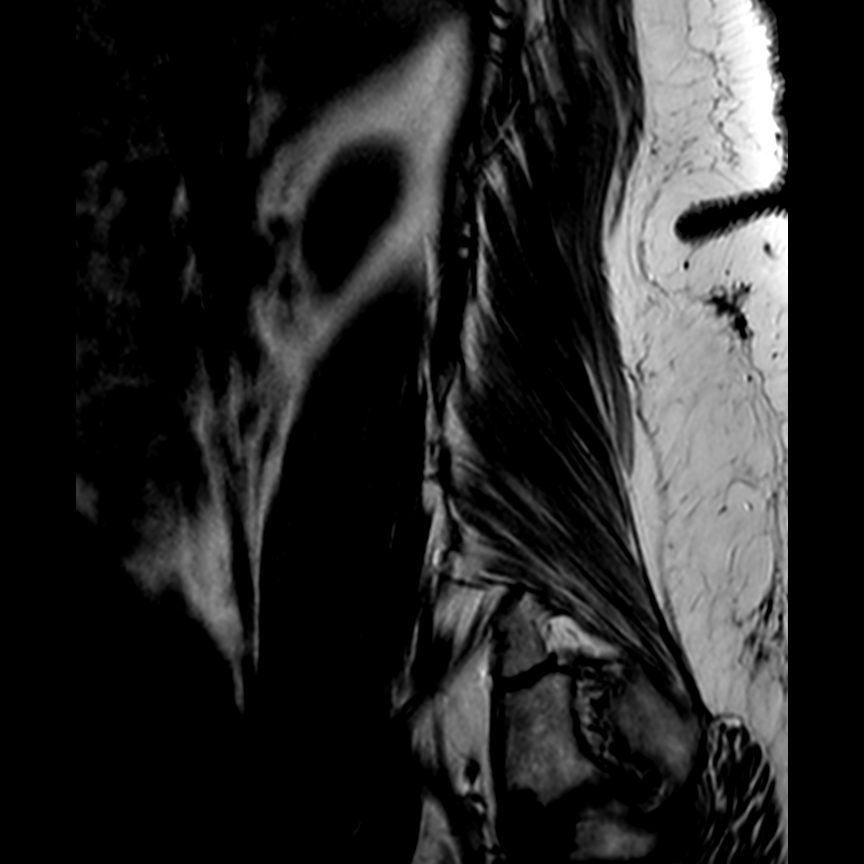

[Series 401: t2w_tse sag · sagittal · 4.0mm · 0.26mm/px · 3 of 17 slices shown]
[im 1/17]
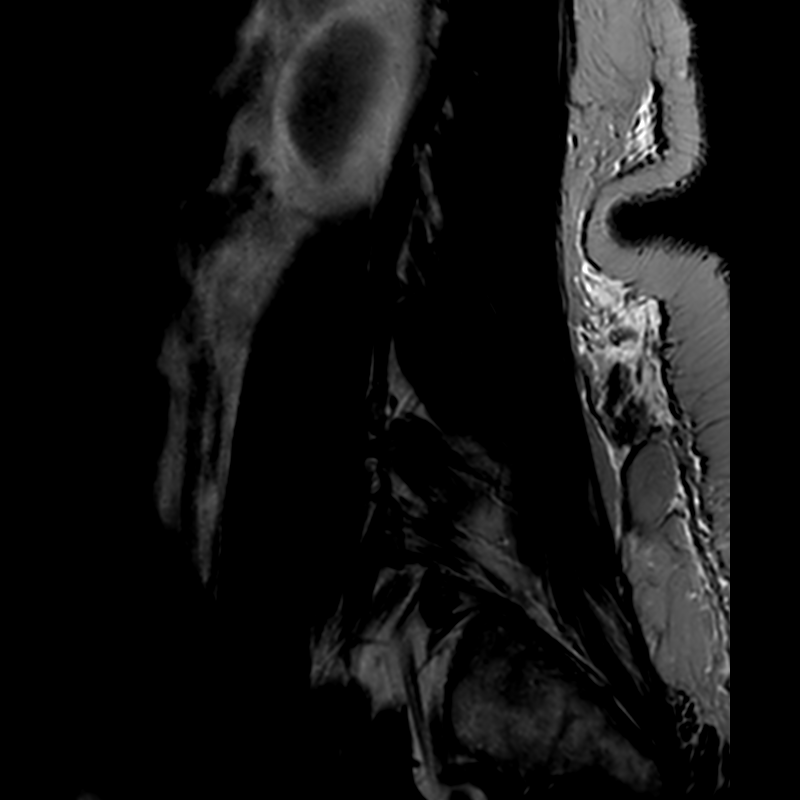
[im 9/17]
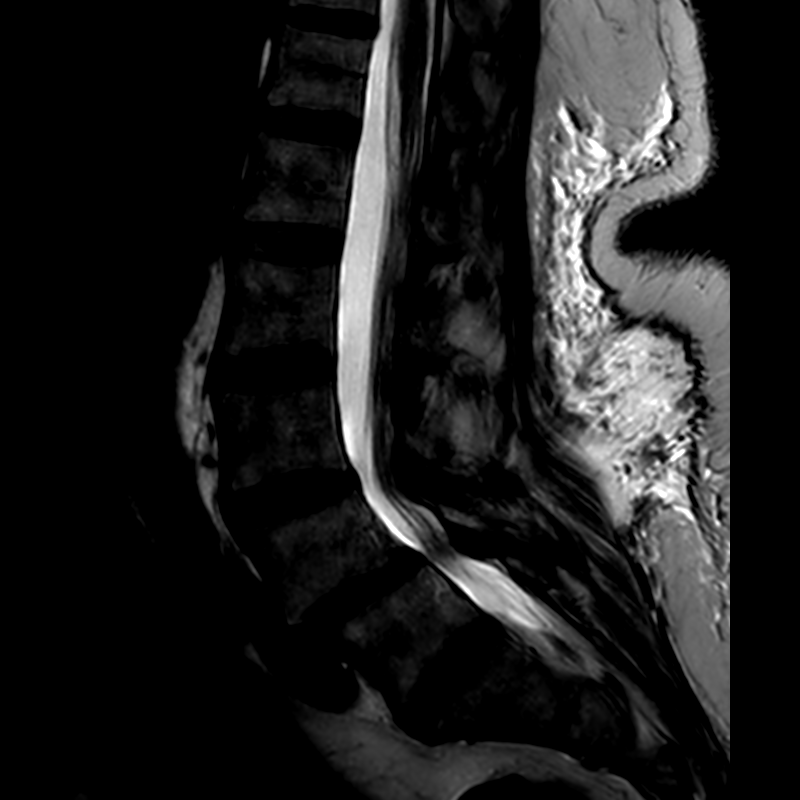
[im 17/17]
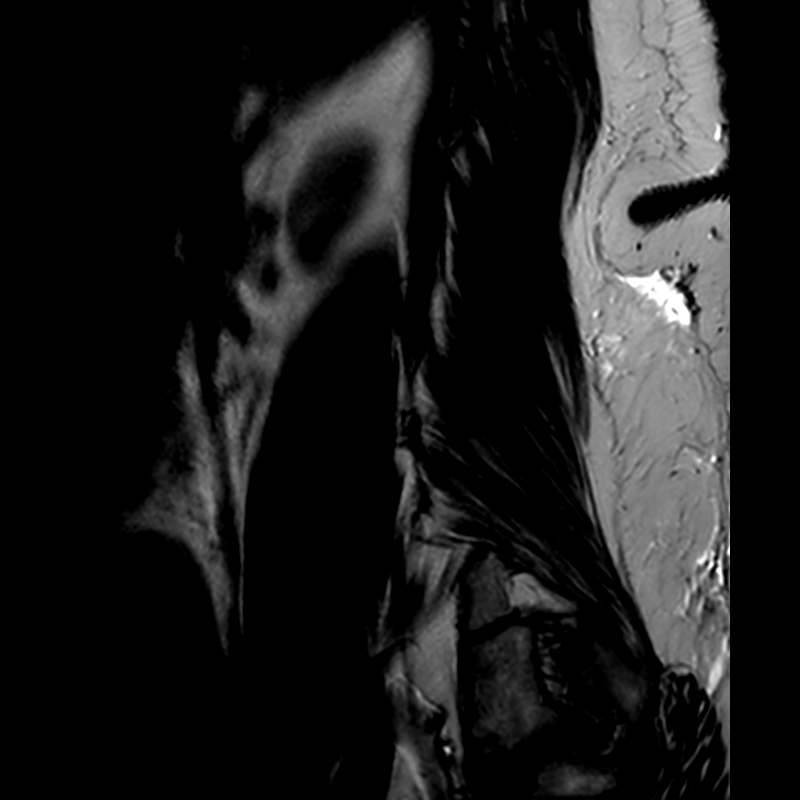

[Series 501: stir_sag-philips · sagittal · 4.0mm · 0.48mm/px · 2 of 17 slices shown]
[im 1/17]
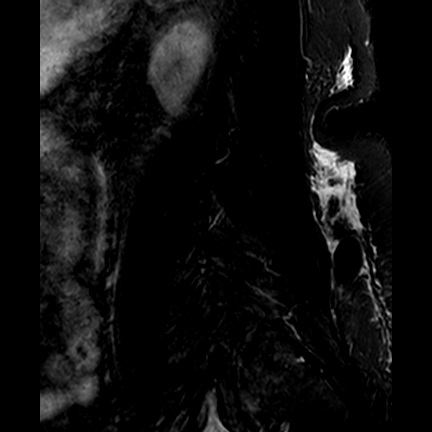
[im 9/17]
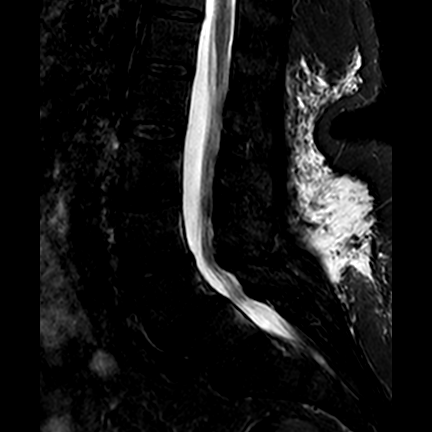

[10 of 48 positions shown; findings below may reference images not displayed]

FINDINGS: -------------------------------------------------------------------------------- 
------ 
GENERAL: 
Nomenclature is based on 5 lumbar type vertebral bodies.     
ALIGNMENT: Normal coronal alignment. 8 mm anterolisthesis L4 on L5 is 
degenerative. No pars defect. 
VERTEBRAL BODY HEIGHT: Normal.  
MARROW SIGNAL: No focal suspect signal abnormality. 
CORD SIGNAL: Normal distal spinal cord and cauda equina. Conus medullaris 
terminates at L1. 
ADDITIONAL FINDINGS: None. 
Modic I-II: None. 
Ligamentum Flavum > 2.5 mm: All levels. 
-------------------------------------------------------------------------------- 
------ 
SEGMENTAL: 
T12-L1: Normal disc height and signal. No herniation. Normal facets. No spinal 
canal or neural foraminal stenosis. 
L1-L2: Normal disc height and signal. No herniation. Normal facets. No spinal 
canal or neural foraminal stenosis. 
L2-L3: Normal disc height with slight loss of disc signal. No herniation. Normal 
facets. No spinal canal or neural foraminal stenosis. 
L3-L4: Mild loss of disc height with slight loss of disc signal. Minimal annular 
bulge. Mild canal stenosis. Mild narrowing right lateral recess. Facet 
arthropathy with mild bilateral foraminal narrowing. Right foraminal disc 
protrusion abuts the exiting right L3 nerve root. 
L4-L5: Loss of disc height and signal with disc uncovering. Mild to moderate 
canal stenosis. Moderate lateral recess narrowing bilaterally. Facet arthropathy 
and ligamentum flavum hypertrophy. Small bilateral facet joint effusions. 
Moderate left foraminal narrowing with evidence of left foraminal disc 
protrusion. Moderate right foraminal narrowing with similar right foraminal disc 
protrusion. Correlate for involvement of the exiting L4 and/or traversing L5 
nerve roots bilaterally. 
L5-S1: Loss of disc signal. Canal and foramina are patent. Bilateral facet 
arthropathy. 
-------------------------------------------------------------------------------- 
------
IMPRESSION: 8 mm anterolisthesis L4 on L5 is degenerative. Mild to moderate canal stenosis 
with moderate lateral recess narrowing bilaterally and moderate bilateral 
foraminal narrowing. Correlate for involvement of the exiting L4 and/or 
traversing L5 nerve roots bilaterally.

## 2023-11-24 IMAGING — MR MRI CERVICAL SPINE WITHOUT CONTRAST
8 of 13 series · 11 of 48 positions shown · IV contrast (gadolinium)
Comparison: Cervical spine x-ray November 09, 2023.

________________________________________________________________________________________________ 
MRI CERVICAL SPINE WITHOUT CONTRAST, 11/24/2023 [DATE]: 
CLINICAL INDICATION: Radiculopathy, cervical region , neck pain with radiation 
into the right shoulder.
TECHNIQUE: Multiplanar, multiecho position MR images of the cervical spine were 
performed without intravenous gadolinium enhancement. Patient was scanned on a 
3T magnet.

[Series 101: survey · axial · 10.0mm · 0.94mm/px · 1 of 9 slices shown (1 of 2)]
[im 1/9]
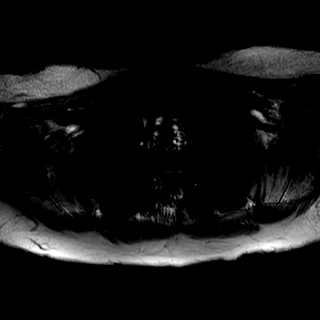

[Series 201: survey · axial · 10.0mm · 0.94mm/px · 1 of 9 slices shown (2 of 2)]
[im 1/9]
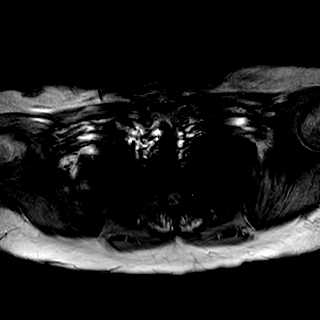

[Series 301: t2w_tse cor · coronal · 5.0mm · 0.52mm/px · 1 of 7 slices shown]
[im 1/7]
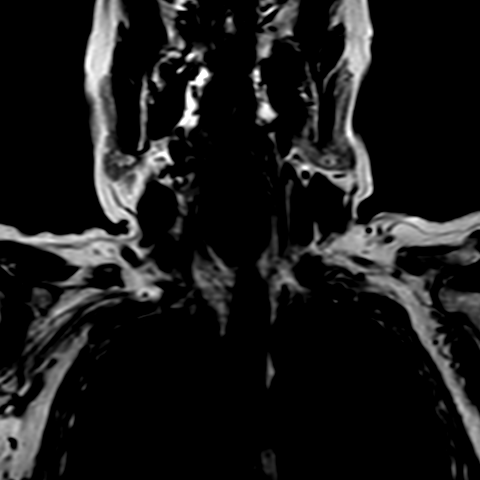

[Series 401: T1 · sagittal · 3.0mm · 0.45mm/px · 1 of 15 slices shown]
[im 1/15]
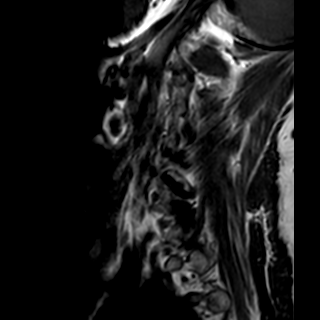

[Series 502: st2w_tse sag fs · sagittal · 3.0mm · 0.38mm/px · 1 of 15 slices shown]
[im 1/15]
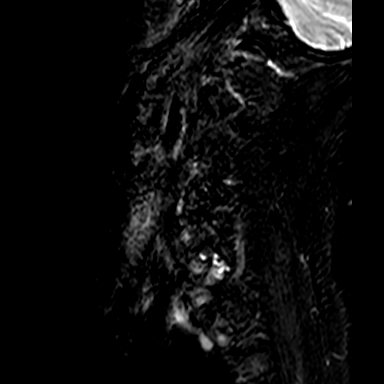

[Series 503: st2w_tse sag · sagittal · 3.0mm · 0.38mm/px · 1 of 15 slices shown]
[im 1/15]
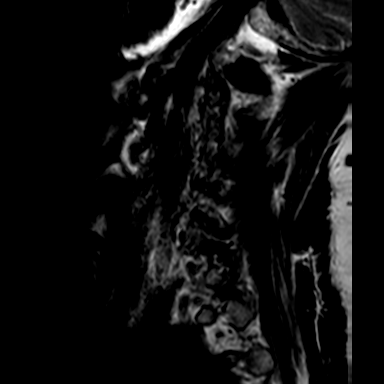

[Series 602: 3dt2 cor/mpr · coronal · 1.0mm · 0.15mm/px · 3 of 80 slices shown]
[im 1/80]
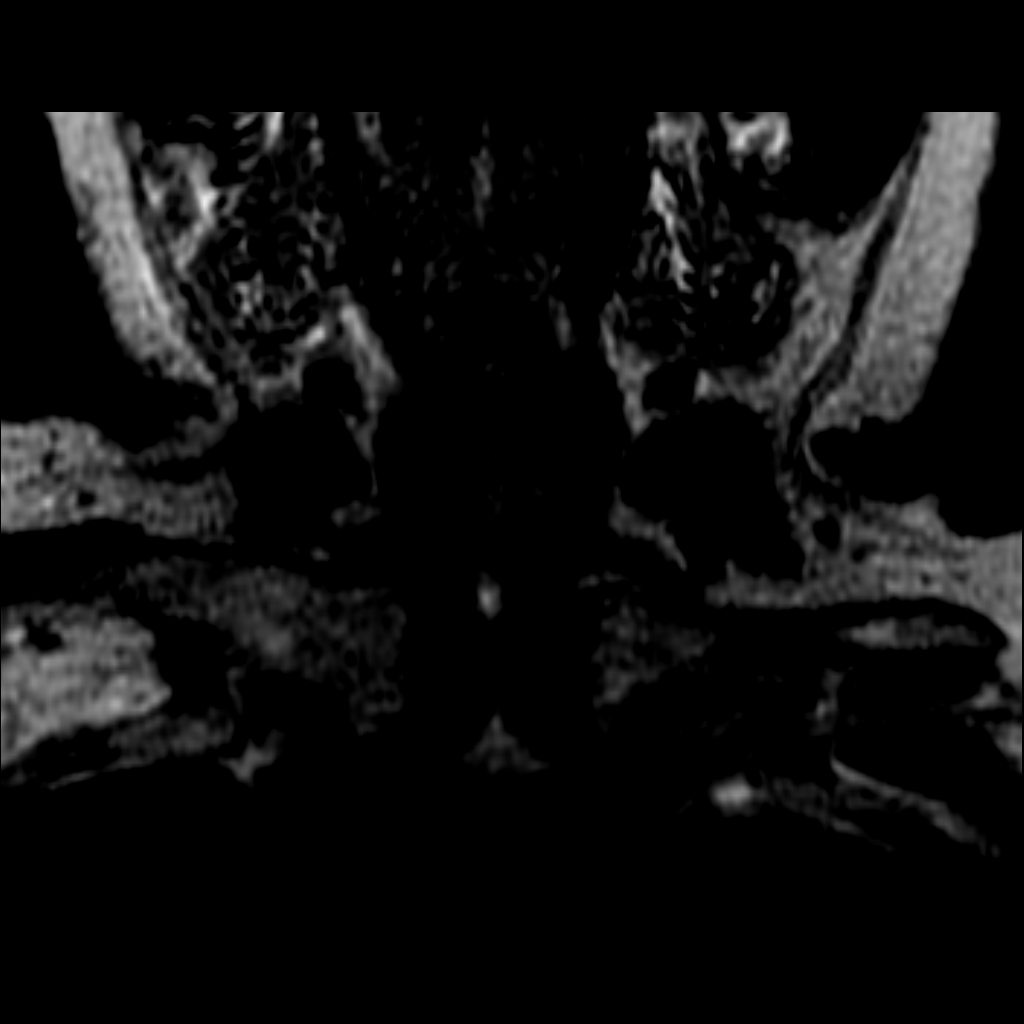
[im 14/80]
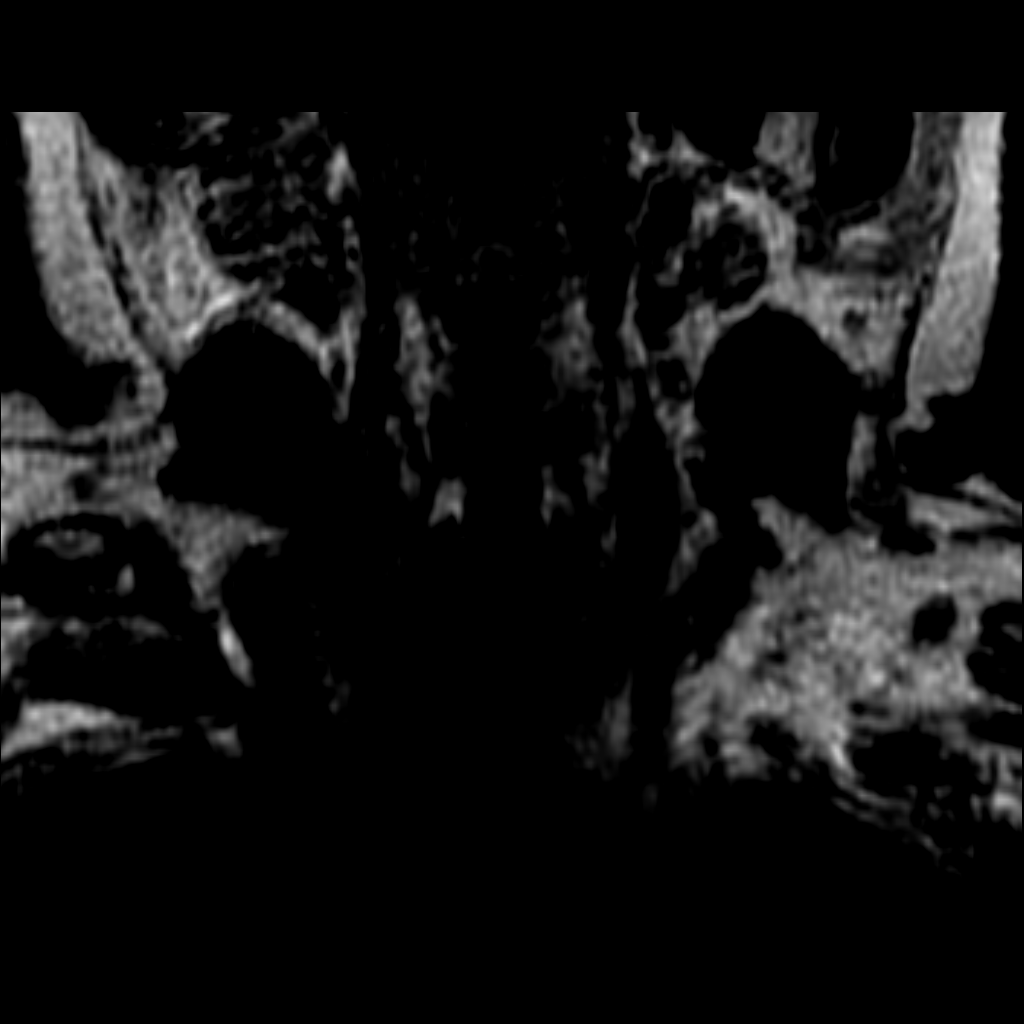
[im 27/80]
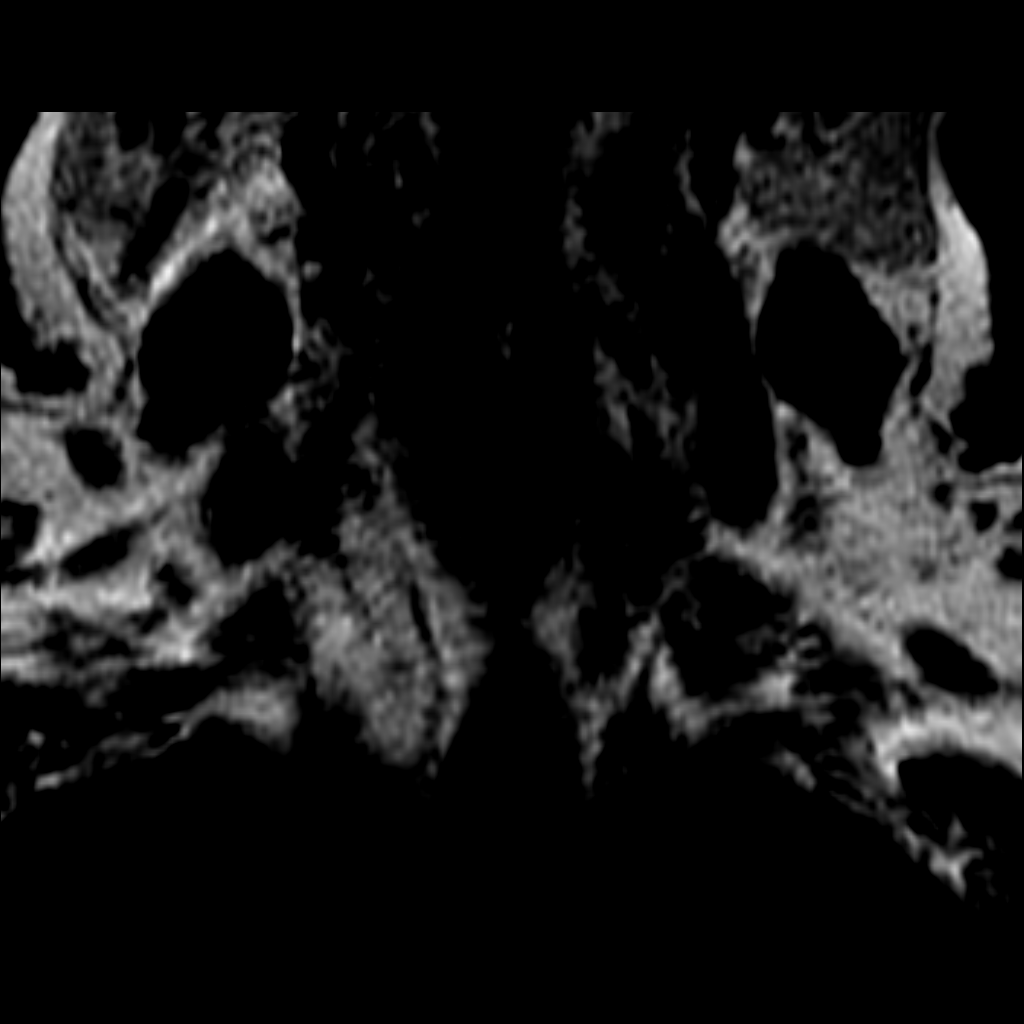

[Series 801: T2 · oblique · 3.0mm · 0.34mm/px · 2 of 18 slices shown]
[im 1/18]
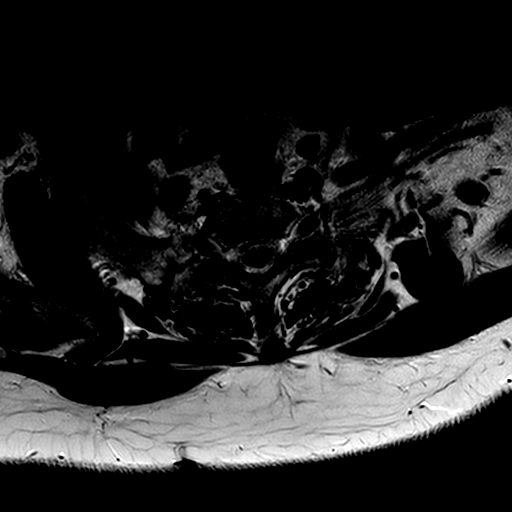
[im 18/18]
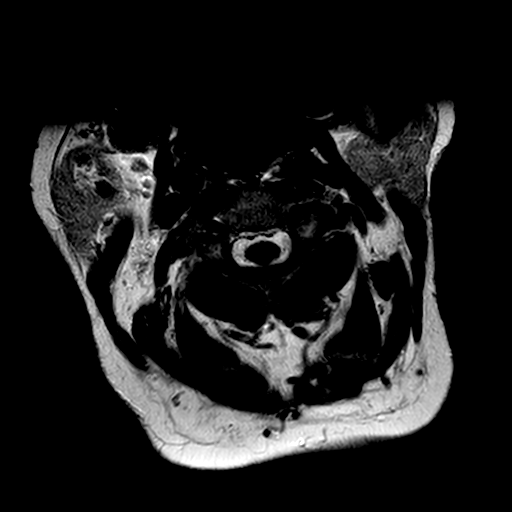

[11 of 48 positions shown; findings below may reference images not displayed]

FINDINGS: -------------------------------------------------------------------------------- 
----------------- 
GENERAL: 
ALIGNMENT: Normal coronal alignment in the cervical spine. There is mild 
levoconvex thoracic scoliosis. Loss of the normal cervical lordosis with 3 mm 
retrolisthesis C4 on C5 and 2 mm anterolisthesis C5 on C6 and C6 on C7. 
VERTEBRAL BODY HEIGHT: Normal.  
MARROW SIGNAL: There is increased marrow signal involving the right superior 
articular facet of C7. This could be related to degenerative change. 
CORD SIGNAL: Normal.  
ADDITIONAL FINDINGS: None. 
-------------------------------------------------------------------------------- 
---------------- 
SEGMENTAL: 
CRANIOCERVICAL JUNCTION: No significant stenosis. 
C2-C3: Normal disc height. Mild ligament flavum hypertrophy. Disc osteophyte 
complex with mild canal stenosis. Bilateral facet arthropathy. The foramina 
appear patent bilaterally. 
C3-C4: Normal disc height. Disc osteophyte complex flattens the ventral cord 
margin with mild canal stenosis. Left-sided facet arthropathy. Right foramen 
patent. Left foramen is moderately to severely narrowed. 
C4-C5: Slight loss of disc height. Disc unroofing. Moderate canal stenosis with 
ventral cord flattening. Uncinate spurring and facet arthropathy with severe 
bilateral foraminal narrowing. Correlate for C5 nerve root involvement. 
C5-C6: Normal disc height. Canal patent. Right-sided uncinate spurring. Left 
foramen is patent. Severe right foraminal narrowing. Normal facets. 
C6-C7: Facet arthropathy. Canal is patent. Right-sided uncinate spurring with 
mild to moderate right foraminal narrowing. Left foramen is patent. 
C7-T1: Normal disc height. No herniation. Normal facets. No spinal canal or 
neural foraminal stenosis. 
Visualized upper thoracic segment is unremarkable. 
-------------------------------------------------------------------------------- 
---------------
IMPRESSION: Motion artifact degrades the study. This limits evaluation of the foramina. 
Could consider dedicated CT cervical spine to further assess. 
Most significant canal stenosis is at C4-C5, moderate. There is ventral cord 
flattening from C3 to C4 through C4-C5. 
Multilevel significant foraminal narrowing as above.

## 2023-11-29 IMAGING — DX SHOULDER RIGHT 3 VIEW
2 series · 2 of 2 positions shown · non-contrast
Comparison: None.

________________________________________________________________________________________________ 
SHOULDER RIGHT 3 VIEW, 11/29/2023 [DATE]: 
CLINICAL INDICATION: Shoulder pain.

[ap int rot]
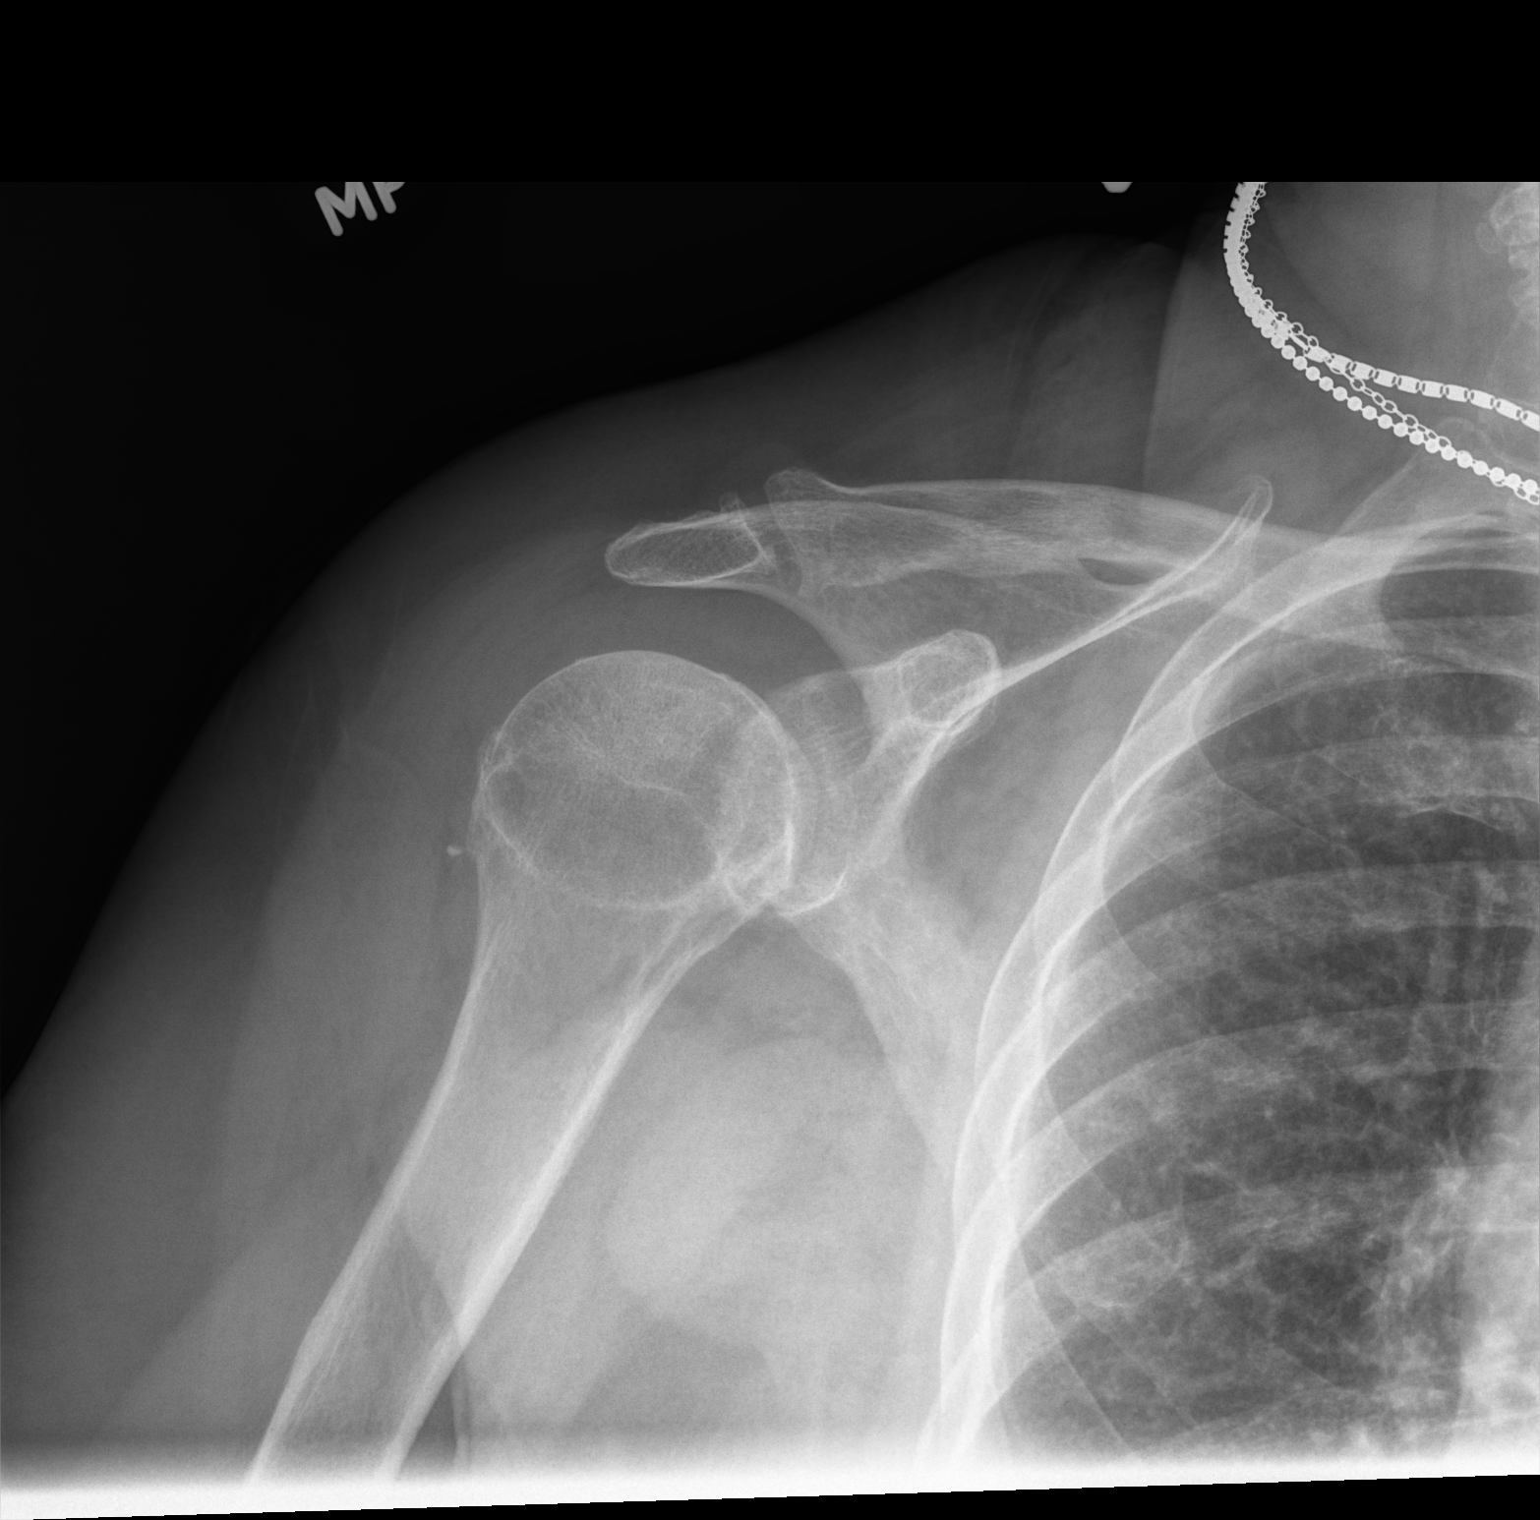

[grashey]
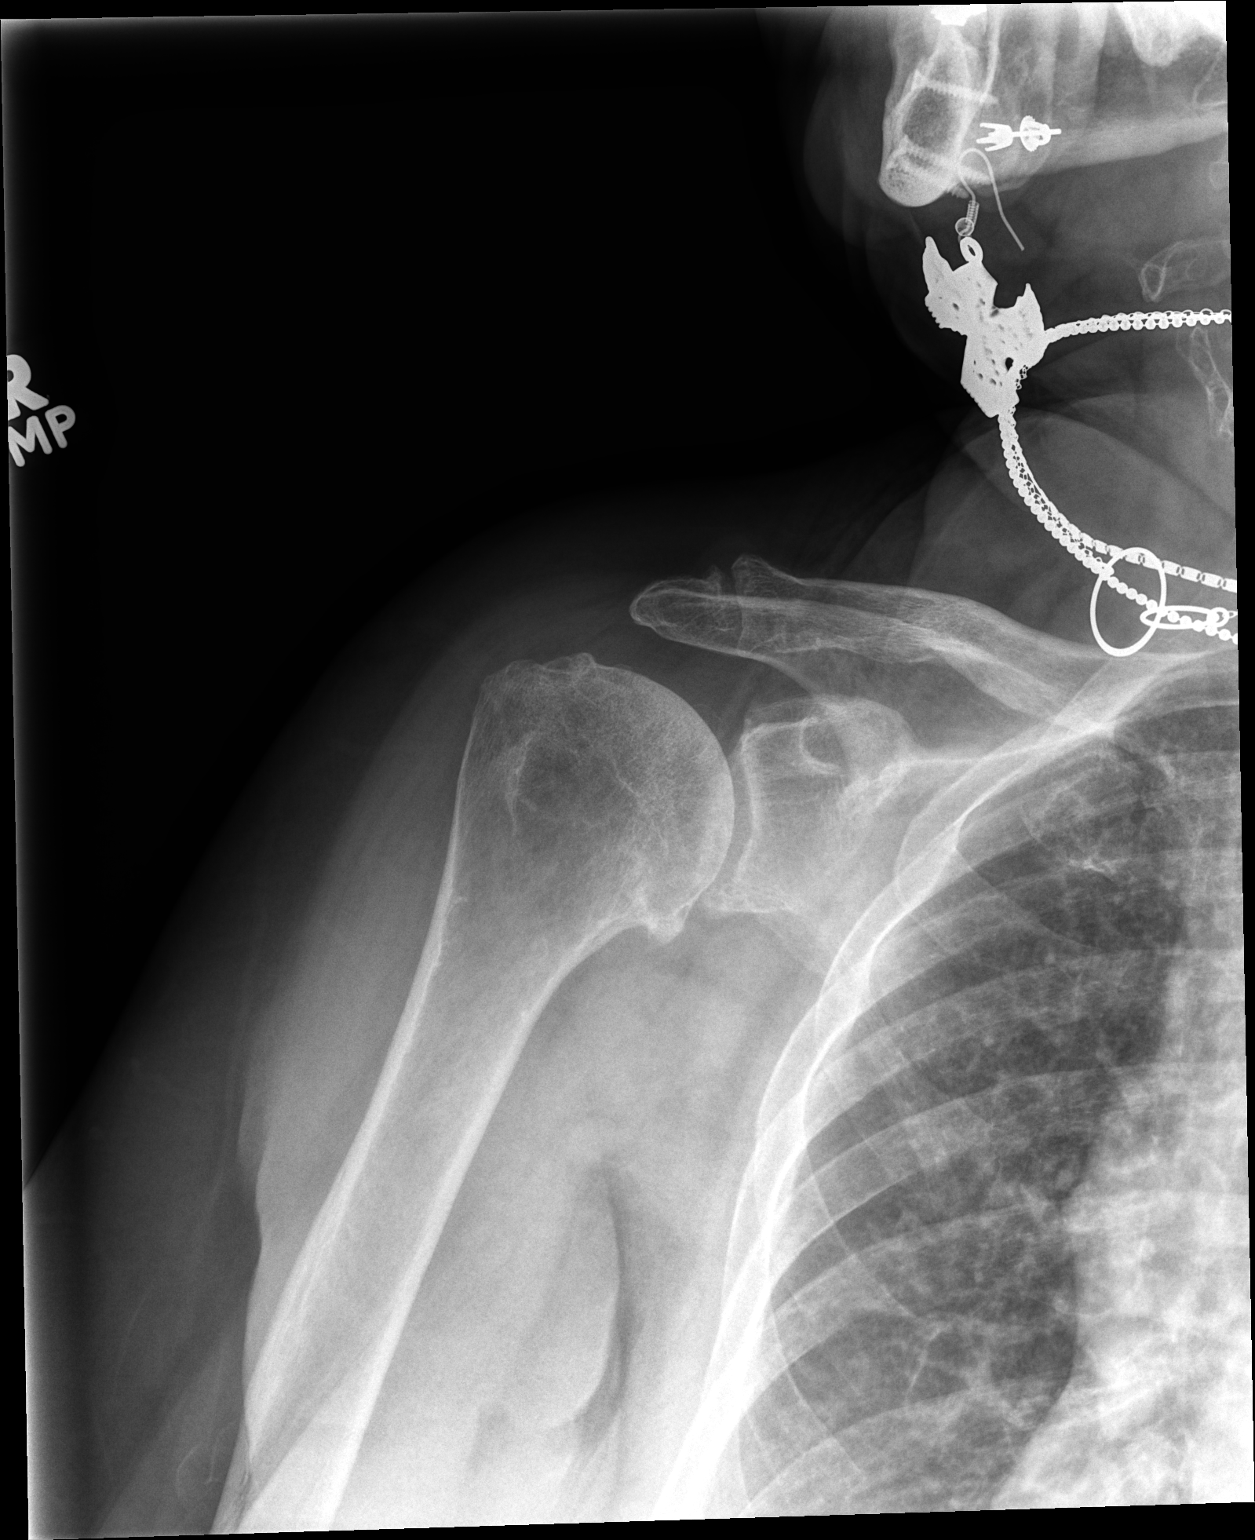

[2 of 2 positions shown; findings below may reference images not displayed]

FINDINGS: No fractures or dislocations. Moderate glenohumeral and mild AC joint 
degenerative change. 0.2 cm calcification posterolateral to the humeral head, 
likely infraspinatus calcific tendinosis. Normal bone density. No soft tissue 
swelling.
IMPRESSION: 1.  Moderate glenohumeral and mild AC joint degenerative change.  
2.  Probable infraspinatus calcific tendinosis.

## 2024-01-17 IMAGING — MG MAMMOGRAPHY DIAGNOSTIC LEFT 3D TOMOSYNTH
4 series · 4 of 12 positions shown · non-contrast
Comparison: Comparison was made to prior examinations.

________________________________________________________________________________________________ 
MAMMOGRAPHY DIAGNOSTIC LEFT 3D TOMOSYNTH, 01/17/2024 [DATE]: 
CLINICAL INDICATION: Other Abnormal And Inconclusive Findings On Diagnostic 
Imagi
TECHNIQUE: Unilateral left breast digital mammography and 3-D Tomosynthesis were 
obtained. In addition, computer-aided detection was utilized. 
BREAST DENSITY: (Level C) The breasts are heterogeneously dense, which may 
obscure small masses.

[L ML]
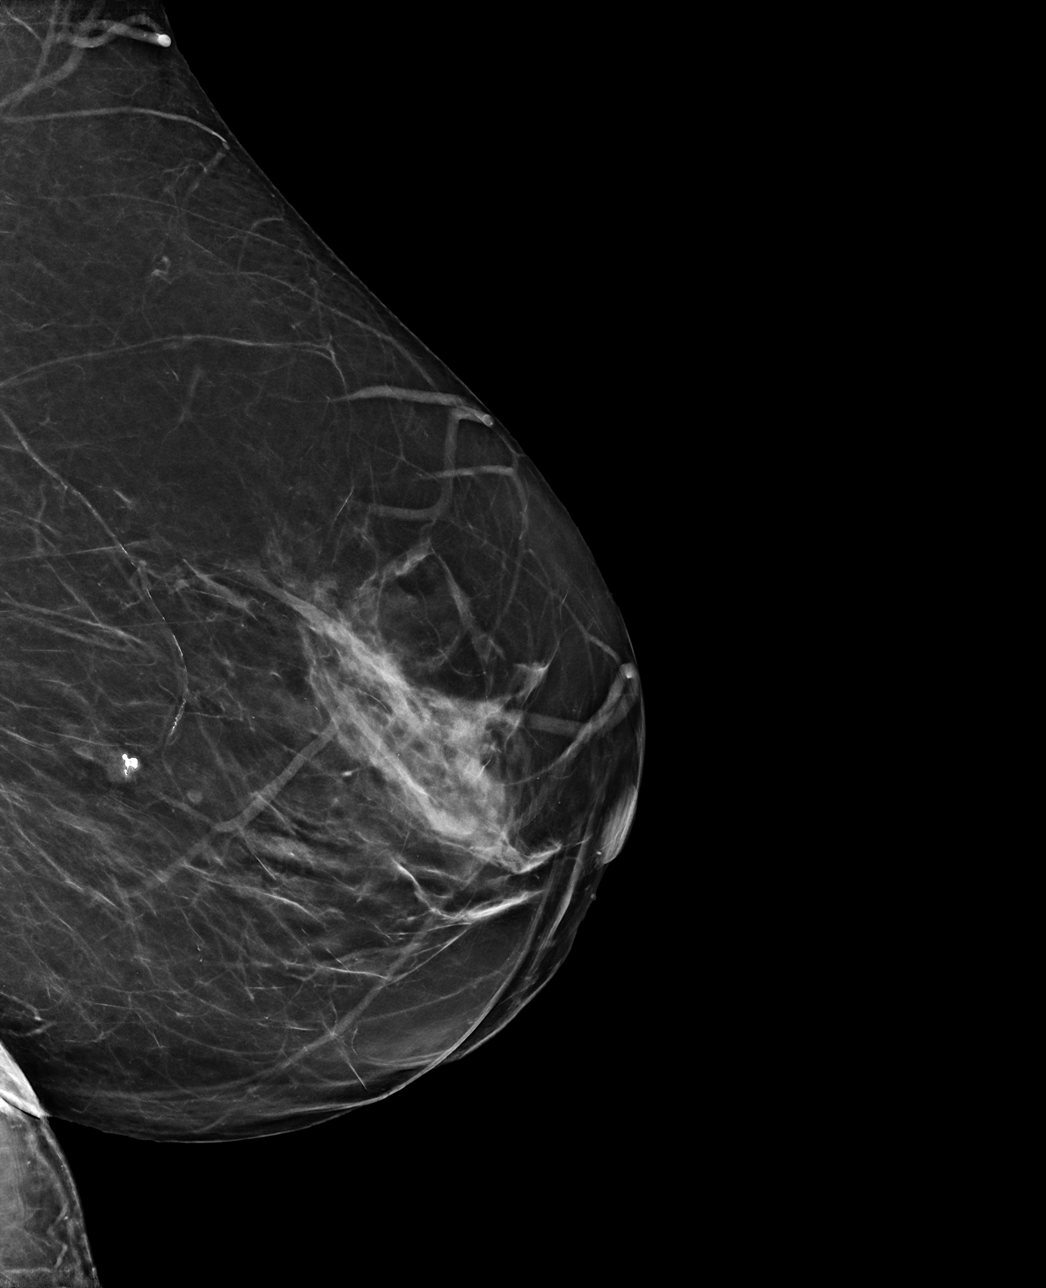

[L MLO]
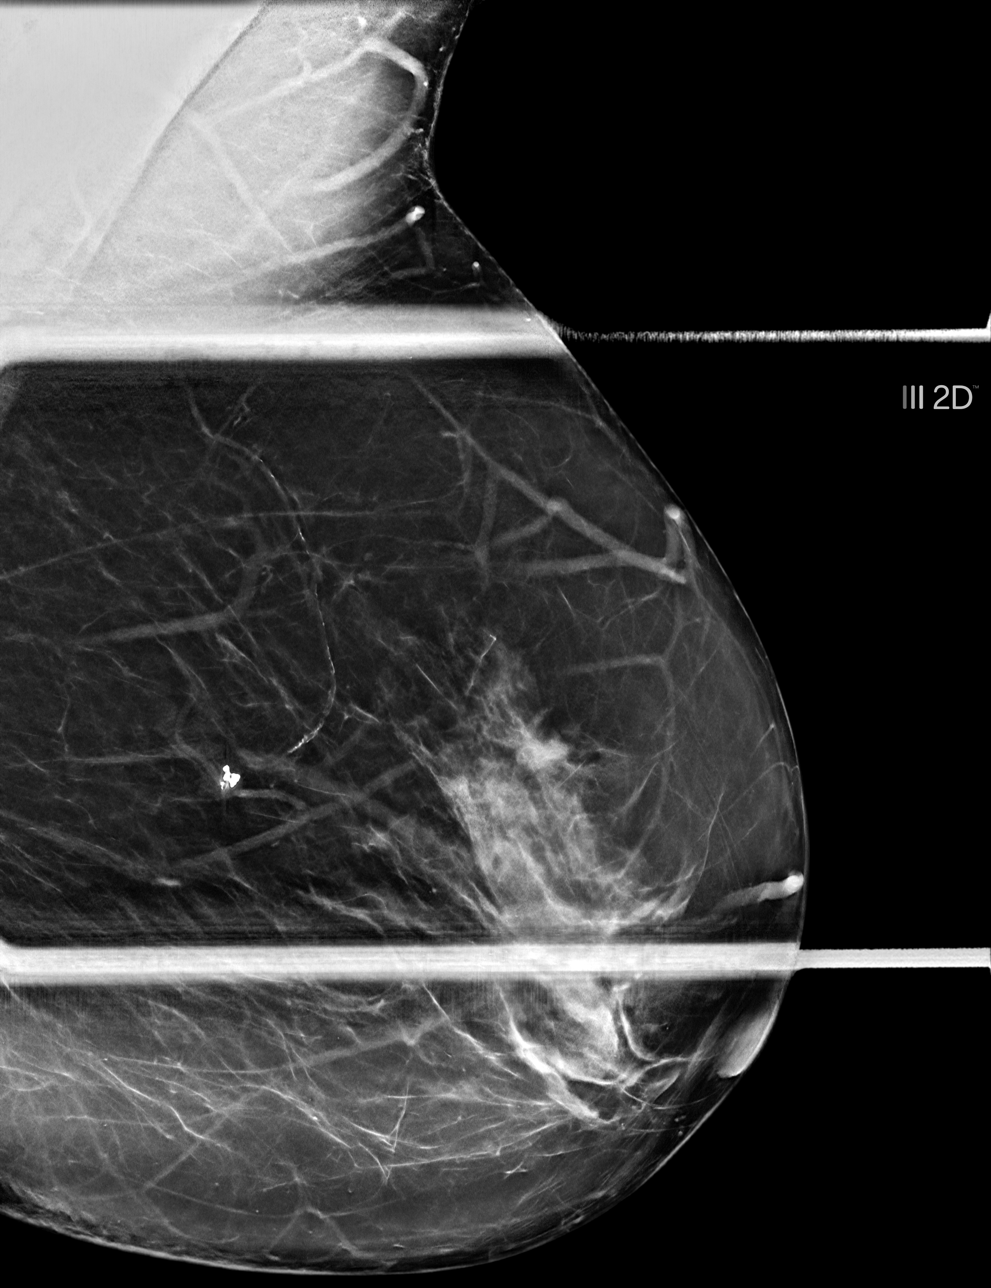

[L MLO tomo · tomo slice 12/23.0]
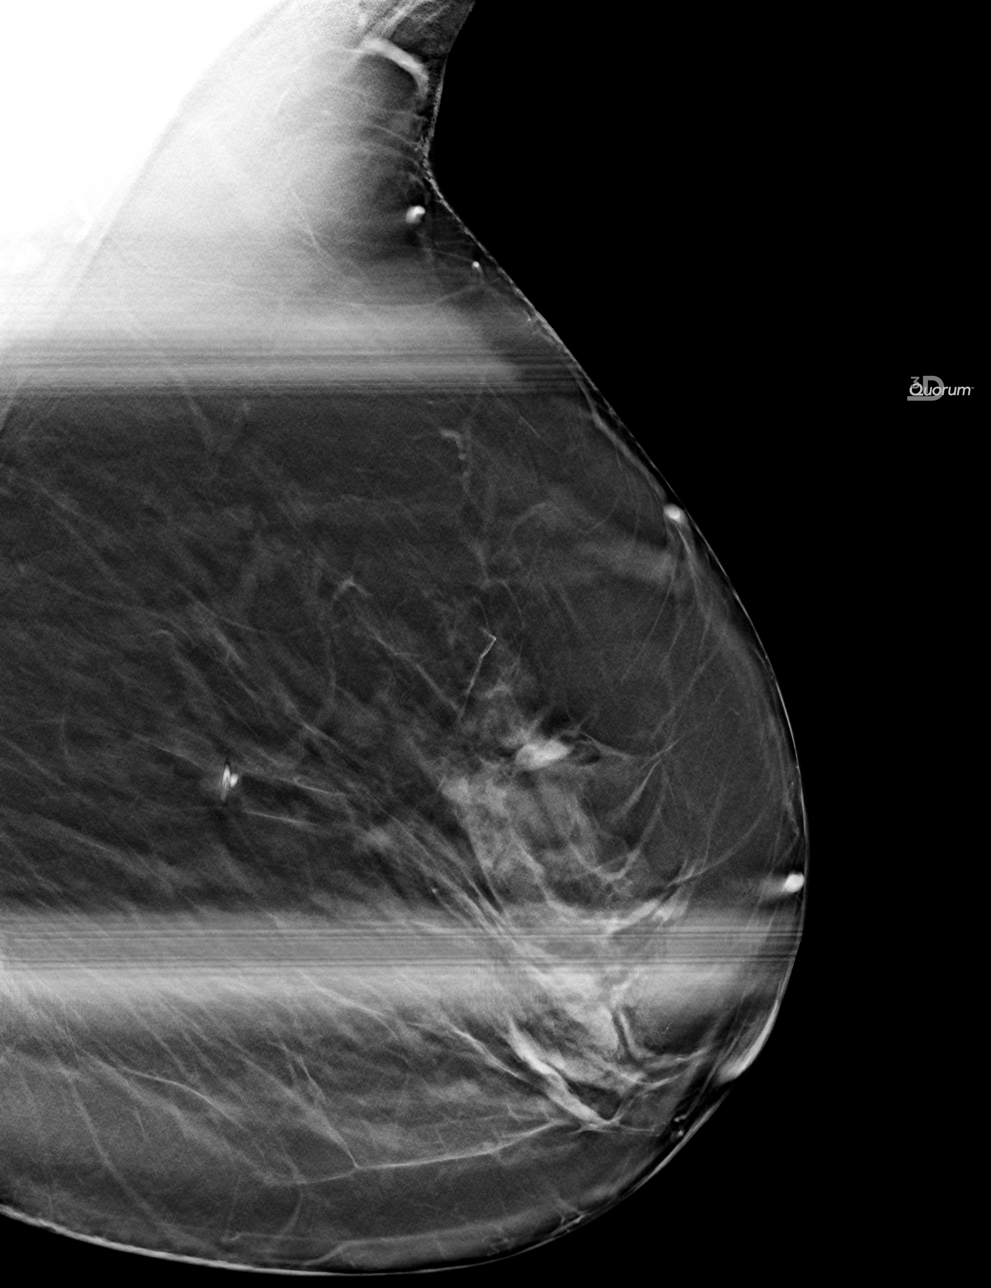

[L ML tomo · tomo slice 12/23.0]
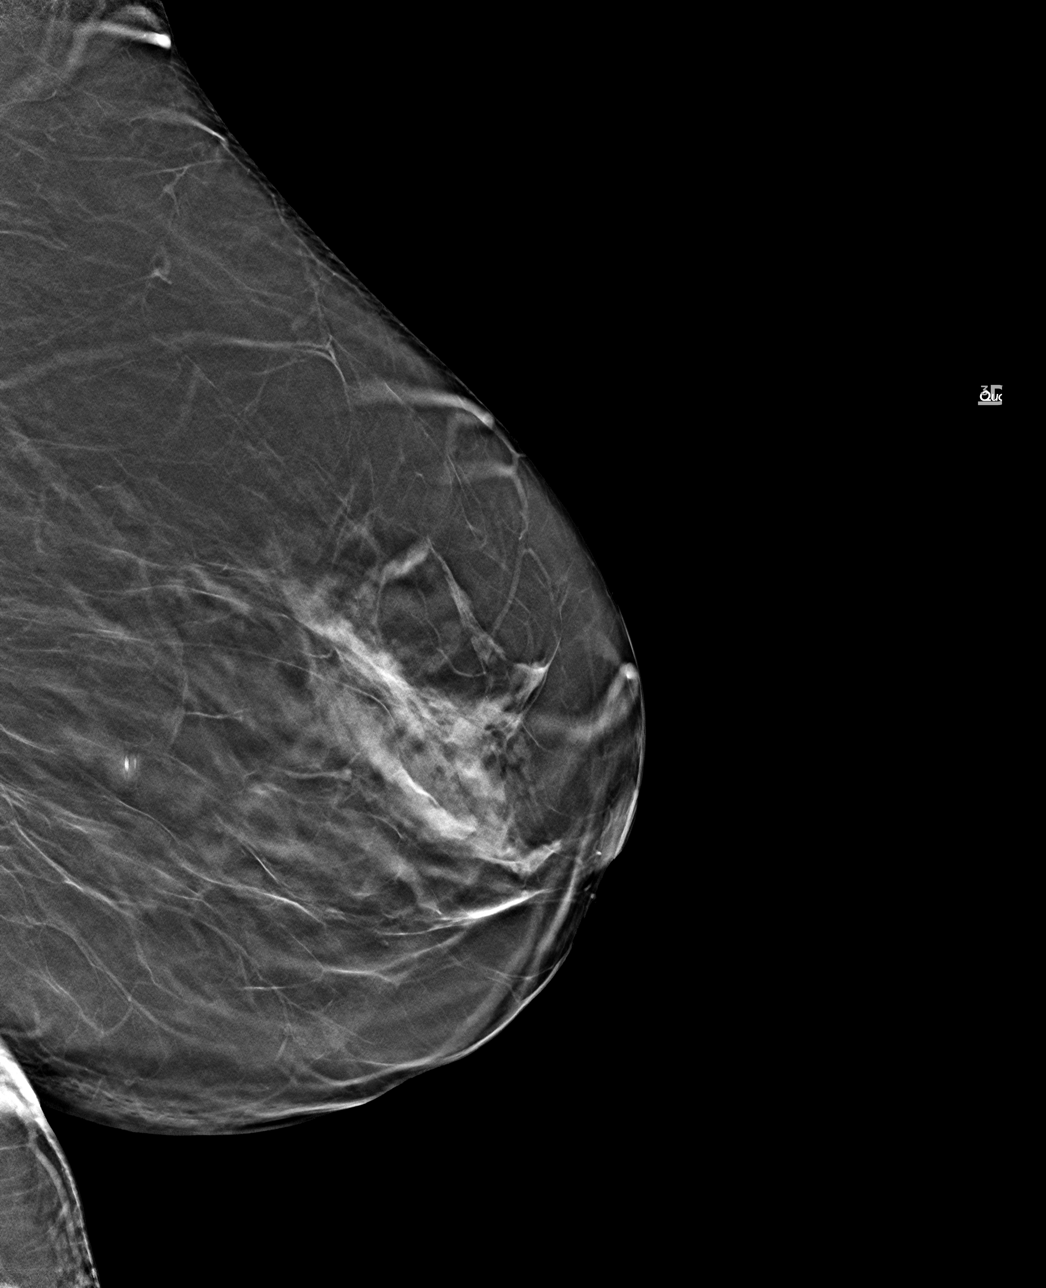

[4 of 12 positions shown; findings below may reference images not displayed]

FINDINGS: Vague nodular density left breast [DATE] position approximately 6 cm 
from the nipple. Ultrasound demonstrated a hypoechoic nodule that has stable 
dating back to ultrasound 3434. Benign calcification. No new suspicious mass, 
calcifications, or area of architectural distortion in the left breast.
IMPRESSION: Stable mammogram.  
(BI-RADS 2) Benign findings. Routine mammographic follow-up is recommended.
